# Patient Record
Sex: Male | Born: 1935 | Race: White | Hispanic: No | Marital: Married | Smoking: Former smoker
Health system: Southern US, Community
[De-identification: ages and names within clinical notes are randomized; demographics above are authoritative.]

## PROBLEM LIST (undated history)

## (undated) DIAGNOSIS — R413 Other amnesia: Secondary | ICD-10-CM

## (undated) DIAGNOSIS — E785 Hyperlipidemia, unspecified: Secondary | ICD-10-CM

## (undated) HISTORY — PX: CATARACT EXTRACTION, BILATERAL: SHX1313

## (undated) HISTORY — PX: APPENDECTOMY: SHX54

## (undated) HISTORY — DX: Other amnesia: R41.3

## (undated) HISTORY — PX: HAMMER TOE SURGERY: SHX385

---

## 2010-10-06 DEATH — deceased

## 2014-03-13 DIAGNOSIS — I358 Other nonrheumatic aortic valve disorders: Secondary | ICD-10-CM | POA: Insufficient documentation

## 2014-03-13 DIAGNOSIS — Z5181 Encounter for therapeutic drug level monitoring: Secondary | ICD-10-CM | POA: Insufficient documentation

## 2015-01-15 DIAGNOSIS — Z86010 Personal history of colonic polyps: Secondary | ICD-10-CM | POA: Insufficient documentation

## 2019-09-01 ENCOUNTER — Encounter: Payer: Self-pay | Admitting: Podiatry

## 2019-09-01 ENCOUNTER — Other Ambulatory Visit: Payer: Self-pay

## 2019-09-01 ENCOUNTER — Ambulatory Visit (INDEPENDENT_AMBULATORY_CARE_PROVIDER_SITE_OTHER): Payer: Medicare Other | Admitting: Podiatry

## 2019-09-01 VITALS — BP 154/84 | HR 49

## 2019-09-01 DIAGNOSIS — L84 Corns and callosities: Secondary | ICD-10-CM | POA: Diagnosis not present

## 2019-09-01 DIAGNOSIS — B351 Tinea unguium: Secondary | ICD-10-CM | POA: Diagnosis not present

## 2019-09-01 DIAGNOSIS — M79674 Pain in right toe(s): Secondary | ICD-10-CM

## 2019-09-01 DIAGNOSIS — M79675 Pain in left toe(s): Secondary | ICD-10-CM

## 2019-09-01 NOTE — Patient Instructions (Signed)

## 2019-09-02 ENCOUNTER — Ambulatory Visit: Payer: Self-pay

## 2019-09-06 NOTE — Progress Notes (Addendum)
Subjective: Shaun [name] presents today referred by Patient, No Pcp Per for complaint of callus(es) b/l feet and painful mycotic toenails b/l that are difficult to trim. Pain interferes with ambulation. Aggravating factors include wearing enclosed shoe gear. Pain is relieved with periodic professional debridement.   He is a retired Biochemist, clinical.  He relates thick, discolored toenails which are tender and calluses >20 years. He has shaved his calluses with scalpel blade over the years. Now seeking professional help to assist with treatment.  History reviewed. No pertinent past medical history.   Patient Active Problem List   Diagnosis Date Noted  . History of colon polyps 01/15/2015  . Aortic valve sclerosis 03/13/2014  . Encounter for therapeutic drug level monitoring 03/13/2014  . GERD (gastroesophageal reflux disease) 08/28/2011  . Hypercholesteremia 08/28/2011     History reviewed. No pertinent surgical history.   Current Outpatient Medications on File Prior to Visit  Medication Sig Dispense Refill  . atorvastatin (LIPITOR) 10 MG tablet Take by mouth.    . Multiple Vitamins-Minerals (MULTIVITAMIN WITH MINERALS) tablet Take 1 tablet by mouth daily.    Marland Kitchen esomeprazole (NEXIUM) 20 MG capsule Take by mouth.     No current facility-administered medications on file prior to visit.     No Known Allergies   Social History   Occupational History  . Not on file  Tobacco Use  . Smoking status: Never Smoker  . Smokeless tobacco: Never Used  Substance and Sexual Activity  . Alcohol use: Not on file  . Drug use: Not on file  . Sexual activity: Not on file     History reviewed. No pertinent family history.    There is no immunization history on file for this patient.   Objective: Vitals:   09/01/19 0905  BP: (!) 154/84  Pulse: (!) 49    Vascular Examination:  Capillary refill time to digits immediate b/l, palpable DP pulses b/l, palpable PT pulses b/l, pedal hair  sparse b/l and skin temperature gradient within normal limits b/l  Dermatological Examination: Pedal skin with normal turgor, texture and tone bilaterally, no open wounds bilaterally, no interdigital macerations bilaterally, toenails 1-5 b/l elongated, dystrophic, thickened, crumbly with subungual debris and hyperkeratotic lesion(s) submet head 5 b/l.  No erythema, no edema, no drainage, no flocculence  Musculoskeletal: Normal muscle strength 5/5 to all lower extremity muscle groups bilaterally, no gross bony deformities bilaterally, no pain crepitus or joint limitation noted with ROM b/l and bunion deformity noted b/l  Neurological: Protective sensation intact 5/5 intact bilaterally with 10g monofilament b/l, vibratory sensation intact b/l and proprioception intact bilaterally  Assessment: No diagnosis found.   Plan: -Medicare ABN signed for 2021 -Toenails 1-5 b/l were debrided in length and girth without iatrogenic bleeding. -corns and calluses were debrided without complication or incident. Total number debrided =2, submet head 5 b/l -Patient to continue soft, supportive shoe gear daily. -Patient to report any pedal injuries to medical professional immediately. -Patient/POA to call should there be question/concern in the interim.  Return in about 3 months (around 11/30/2019) for nail and callus trim.

## 2019-09-11 ENCOUNTER — Ambulatory Visit: Payer: Medicare Other | Attending: Internal Medicine

## 2019-09-11 DIAGNOSIS — Z23 Encounter for immunization: Secondary | ICD-10-CM | POA: Insufficient documentation

## 2019-09-11 NOTE — Progress Notes (Signed)
   Covid-19 Vaccination Clinic  Name:  Kavish Lafitte    MRN: [id number] DOB: September 25, 1934  09/11/2019  Mr. Pieczynski was observed post Covid-19 immunization for 15 minutes without incidence. He was provided with Vaccine Information Sheet and instruction to access the V-Safe system.   Mr. Stroebel was instructed to call 911 with any severe reactions post vaccine: Marland Kitchen Difficulty breathing  . Swelling of your face and throat  . A fast heartbeat  . A bad rash all over your body  . Dizziness and weakness    Immunizations Administered    Name Date Dose VIS Date Route   Pfizer COVID-19 Vaccine 09/11/2019  4:43 PM 0.3 mL 07/18/2019 Intramuscular   Manufacturer: [employer], [employer]   Lot: [id number]   NDC: [address]-6

## 2019-09-23 ENCOUNTER — Ambulatory Visit: Payer: Self-pay

## 2019-09-26 DIAGNOSIS — H409 Unspecified glaucoma: Secondary | ICD-10-CM | POA: Insufficient documentation

## 2019-09-26 DIAGNOSIS — R030 Elevated blood-pressure reading, without diagnosis of hypertension: Secondary | ICD-10-CM | POA: Insufficient documentation

## 2019-09-26 DIAGNOSIS — Z87440 Personal history of urinary (tract) infections: Secondary | ICD-10-CM | POA: Insufficient documentation

## 2019-10-06 ENCOUNTER — Ambulatory Visit: Payer: Medicare Other | Attending: Internal Medicine

## 2019-10-06 DIAGNOSIS — Z23 Encounter for immunization: Secondary | ICD-10-CM | POA: Insufficient documentation

## 2019-10-06 NOTE — Progress Notes (Signed)
   Covid-19 Vaccination Clinic  Name:  Shaun [name]    MRN: [id number] DOB: Nov 28, 1934  10/06/2019  [name] was observed post Covid-19 immunization for 15 minutes without incidence. He was provided with Vaccine Information Sheet and instruction to access the V-Safe system.   [name] was instructed to call 911 with any severe reactions post vaccine: Marland Kitchen Difficulty breathing  . Swelling of your face and throat  . A fast heartbeat  . A bad rash all over your body  . Dizziness and weakness    Immunizations Administered    Name Date Dose VIS Date Route   Pfizer COVID-19 Vaccine 10/06/2019  2:14 PM 0.3 mL 07/18/2019 Intramuscular   Manufacturer: [employer], [employer]   Lot: [id number]   NDC: [address]-1

## 2019-12-01 ENCOUNTER — Encounter: Payer: Self-pay | Admitting: Podiatry

## 2019-12-01 ENCOUNTER — Ambulatory Visit (INDEPENDENT_AMBULATORY_CARE_PROVIDER_SITE_OTHER): Payer: Medicare Other | Admitting: Podiatry

## 2019-12-01 ENCOUNTER — Other Ambulatory Visit: Payer: Self-pay

## 2019-12-01 VITALS — HR 96

## 2019-12-01 DIAGNOSIS — M79675 Pain in left toe(s): Secondary | ICD-10-CM

## 2019-12-01 DIAGNOSIS — B351 Tinea unguium: Secondary | ICD-10-CM

## 2019-12-01 DIAGNOSIS — M79674 Pain in right toe(s): Secondary | ICD-10-CM | POA: Diagnosis not present

## 2019-12-01 DIAGNOSIS — M79672 Pain in left foot: Secondary | ICD-10-CM

## 2019-12-01 DIAGNOSIS — L84 Corns and callosities: Secondary | ICD-10-CM | POA: Diagnosis not present

## 2019-12-01 DIAGNOSIS — M79671 Pain in right foot: Secondary | ICD-10-CM | POA: Diagnosis not present

## 2019-12-01 NOTE — Progress Notes (Signed)
Subjective: Shaun [name] presents today for follow up of callus(es) b/l and painful mycotic toenails b/l that are difficult to trim. Pain interferes with ambulation. Aggravating factors include wearing enclosed shoe gear. Pain is relieved with periodic professional debridement.   No Known Allergies   Objective: Vitals:   12/01/19 0856  Pulse: 96    Pt 85 y.o. year old male  in NAD. AAO x 3.   Vascular Examination:  Capillary refill time to digits immediate b/l. Palpable DP pulses b/l. Palpable PT pulses b/l. Pedal hair sparse b/l. Skin temperature gradient within normal limits b/l.  Dermatological Examination: Pedal skin with normal turgor, texture and tone bilaterally. No open wounds bilaterally. No interdigital macerations bilaterally. Toenails 1-5 b/l elongated, dystrophic, thickened, crumbly with subungual debris and tenderness to dorsal palpation. Hyperkeratotic lesion(s) submet head 5 left foot and submet head 5 right foot.  No erythema, no edema, no drainage, no flocculence.  Musculoskeletal: Normal muscle strength 5/5 to all lower extremity muscle groups bilaterally. No pain crepitus or joint limitation noted with ROM b/l. Hallux valgus with bunion deformity noted b/l. Hammertoes noted to the 2-5 bilaterally.  Neurological: Protective sensation intact 5/5 intact bilaterally with 10g monofilament b/l. Vibratory sensation intact b/l. Proprioception intact bilaterally. Babinski reflex negative b/l. Clonus negative b/l.  Assessment: 1. Pain due to onychomycosis of toenails of both feet   2. Callus   3. Pain in both feet    Plan: -ABN on file for 2021. -Toenails 1-5 b/l were debrided in length and girth with sterile nail nippers and dremel without iatrogenic bleeding.  -Callus(es) submet head 5 left foot and submet head 5 right foot were debrided without complication or incident. Total number debrided =2. -Patient to continue soft, supportive shoe gear daily. -Patient to  report any pedal injuries to medical professional immediately. -Patient/POA to call should there be question/concern in the interim.  Return in about 3 months (around 03/01/2020) for nail and callus trim.

## 2019-12-01 NOTE — Patient Instructions (Signed)
Corns and Calluses Corns are small areas of thickened skin that occur on the top, sides, or tip of a toe. They contain a cone-shaped core with a point that can press on a nerve below. This causes pain.  Calluses are areas of thickened skin that can occur anywhere on the body, including the hands, fingers, palms, soles of the feet, and heels. Calluses are usually larger than corns. What are the causes? Corns and calluses are caused by rubbing (friction) or pressure, such as from shoes that are too tight or do not fit properly. What increases the risk? Corns are more likely to develop in people who have misshapen toes (toe deformities), such as hammer toes. Calluses can occur with friction to any area of the skin. They are more likely to develop in people who:  Work with their hands.  Wear shoes that fit poorly, are too tight, or are high-heeled.  Have toe deformities. What are the signs or symptoms? Symptoms of a corn or callus include:  A hard growth on the skin.  Pain or tenderness under the skin.  Redness and swelling.  Increased discomfort while wearing tight-fitting shoes, if your feet are affected. If a corn or callus becomes infected, symptoms may include:  Redness and swelling that gets worse.  Pain.  Fluid, blood, or pus draining from the corn or callus. How is this diagnosed? Corns and calluses may be diagnosed based on your symptoms, your medical history, and a physical exam. How is this treated? Treatment for corns and calluses may include:  Removing the cause of the friction or pressure. This may involve: ? Changing your shoes. ? Wearing shoe inserts (orthotics) or other protective layers in your shoes, such as a corn pad. ? Wearing gloves.  Applying medicine to the skin (topical medicine) to help soften skin in the hardened, thickened areas.  Removing layers of dead skin with a file to reduce the size of the corn or callus.  Removing the corn or callus with a  scalpel or laser.  Taking antibiotic medicines, if your corn or callus is infected.  Having surgery, if a toe deformity is the cause. Follow these instructions at home:   Take over-the-counter and prescription medicines only as told by your health care provider.  If you were prescribed an antibiotic, take it as told by your health care provider. Do not stop taking it even if your condition starts to improve.  Wear shoes that fit well. Avoid wearing high-heeled shoes and shoes that are too tight or too loose.  Wear any padding, protective layers, gloves, or orthotics as told by your health care provider.  Soak your hands or feet and then use a file or pumice stone to soften your corn or callus. Do this as told by your health care provider.  Check your corn or callus every day for symptoms of infection. Contact a health care provider if you:  Notice that your symptoms do not improve with treatment.  Have redness or swelling that gets worse.  Notice that your corn or callus becomes painful.  Have fluid, blood, or pus coming from your corn or callus.  Have new symptoms. Summary  Corns are small areas of thickened skin that occur on the top, sides, or tip of a toe.  Calluses are areas of thickened skin that can occur anywhere on the body, including the hands, fingers, palms, and soles of the feet. Calluses are usually larger than corns.  Corns and calluses are caused by   rubbing (friction) or pressure, such as from shoes that are too tight or do not fit properly.  Treatment may include wearing any padding, protective layers, gloves, or orthotics as told by your health care provider. This information is not intended to replace advice given to you by your health care provider. Make sure you discuss any questions you have with your health care provider. Document Revised: 11/13/2018 Document Reviewed: 06/06/2017 Elsevier Patient Education  2020 [employer]  Onychomycosis/Fungal  Toenails  WHAT IS IT? An infection that lies within the keratin of your nail plate that is caused by a fungus.  WHY ME? Fungal infections affect all ages, sexes, races, and creeds.  There may be many factors that predispose you to a fungal infection such as age, coexisting medical conditions such as diabetes, or an autoimmune disease; stress, medications, fatigue, genetics, etc.  Bottom line: fungus thrives in a warm, moist environment and your shoes offer such a location.  IS IT CONTAGIOUS? Theoretically, yes.  You do not want to share shoes, nail clippers or files with someone who has fungal toenails.  Walking around barefoot in the same room or sleeping in the same bed is unlikely to transfer the organism.  It is important to realize, however, that fungus can spread easily from one nail to the next on the same foot.  HOW DO WE TREAT THIS?  There are several ways to treat this condition.  Treatment may depend on many factors such as age, medications, pregnancy, liver and kidney conditions, etc.  It is best to ask your doctor which options are available to you.  4. No treatment.   Unlike many other medical concerns, you can live with this condition.  However for many people this can be a painful condition and may lead to ingrown toenails or a bacterial infection.  It is recommended that you keep the nails cut short to help reduce the amount of fungal nail. 5. Topical treatment.  These range from herbal remedies to prescription strength nail lacquers.  About 40-50% effective, topicals require twice daily application for approximately 9 to 12 months or until an entirely new nail has grown out.  The most effective topicals are medical grade medications available through physicians offices. 6. Oral antifungal medications.  With an 80-90% cure rate, the most common oral medication requires 3 to 4 months of therapy and stays in your system for a year as the new nail grows out.  Oral antifungal medications do  require blood work to make sure it is a safe drug for you.  A liver function panel will be performed prior to starting the medication and after the first month of treatment.  It is important to have the blood work performed to avoid any harmful side effects.  In general, this medication safe but blood work is required. 7. Laser Therapy.  This treatment is performed by applying a specialized laser to the affected nail plate.  This therapy is noninvasive, fast, and non-painful.  It is not covered by insurance and is therefore, out of pocket.  The results have been very good with a 80-95% cure rate.  The [facility] is the only practice in the area to offer this therapy. 8. Permanent Nail Avulsion.  Removing the entire nail so that a new nail will not grow back. 

## 2020-03-01 ENCOUNTER — Other Ambulatory Visit: Payer: Self-pay

## 2020-03-01 ENCOUNTER — Encounter: Payer: Self-pay | Admitting: Podiatry

## 2020-03-01 ENCOUNTER — Ambulatory Visit (INDEPENDENT_AMBULATORY_CARE_PROVIDER_SITE_OTHER): Payer: Medicare Other | Admitting: Podiatry

## 2020-03-01 DIAGNOSIS — I872 Venous insufficiency (chronic) (peripheral): Secondary | ICD-10-CM

## 2020-03-01 DIAGNOSIS — M79675 Pain in left toe(s): Secondary | ICD-10-CM | POA: Diagnosis not present

## 2020-03-01 DIAGNOSIS — M79671 Pain in right foot: Secondary | ICD-10-CM

## 2020-03-01 DIAGNOSIS — R600 Localized edema: Secondary | ICD-10-CM | POA: Diagnosis not present

## 2020-03-01 DIAGNOSIS — L84 Corns and callosities: Secondary | ICD-10-CM

## 2020-03-01 DIAGNOSIS — B351 Tinea unguium: Secondary | ICD-10-CM | POA: Diagnosis not present

## 2020-03-01 DIAGNOSIS — M79674 Pain in right toe(s): Secondary | ICD-10-CM | POA: Diagnosis not present

## 2020-03-01 NOTE — Progress Notes (Signed)
Subjective: Shaun [name] presents today for follow up of callus(es) b/l and painful mycotic toenails b/l that are difficult to trim. Pain interferes with ambulation. Aggravating factors include wearing enclosed shoe gear. Pain is relieved with periodic professional debridement.   He states his wife mentioned he has swelling in his ankles. He relates no pain, no evidence of trauma. 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   No Known Allergies   Objective: There were no vitals filed for this visit.  Pt is a pleasant 85 y.o. year old Caucasian male  in NAD. AAO x 3.   Vascular Examination:  Capillary refill time to digits immediate b/l. Palpable pedal pulses b/l LE. Pedal hair sparse. Lower extremity skin temperature gradient within normal limits. +1 pitting edema left ankle and right ankle.  Dermatological Examination: Pedal skin with normal turgor, texture and tone bilaterally. No open wounds bilaterally. No interdigital macerations bilaterally. Toenails 1-5 b/l elongated, discolored, dystrophic, thickened, crumbly with subungual debris and tenderness to dorsal palpation. Hyperkeratotic lesion(s) R 2nd toe, submet head 5 left foot and submet head 5 right foot.  No erythema, no edema, no drainage, no flocculence.  Musculoskeletal: Normal muscle strength 5/5 to all lower extremity muscle groups bilaterally. No pain crepitus or joint limitation noted with ROM b/l. Hallux valgus with bunion deformity noted b/l lower extremities. Hammertoes noted to the 2-5 bilaterally. Patient ambulates independent of any assistive aids.  Neurological: Protective sensation intact 5/5 intact bilaterally with 10g monofilament b/l. Vibratory sensation intact b/l. Proprioception intact bilaterally. Babinski reflex negative b/l. Clonus negative b/l.  Assessment: 1. Pain due to onychomycosis of toenails of both feet   2. Callus   3. Pain in both feet   4. Localized  edema   5. Venous (peripheral) insufficiency    Plan: -ABN on file for 2021. -Toenails 1-5 b/l were debrided in length and girth with sterile nail nippers and dremel without iatrogenic bleeding.  -Callus(es) submet head 5 left foot and submet head 5 right foot were debrided without complication or incident. Total number debrided =2. -Discussed edema and need for compression knee highs. Prescription given for [facility] for compression knee highs, 15-20 mmHg. Information dispensed on how to apply compression hose and chronic venous insufficiency. -Patient to continue soft, supportive shoe gear daily. -Patient to report any pedal injuries to medical professional immediately. -Patient/POA to call should there be question/concern in the interim.  Return in about 3 months (around 06/01/2020) for nail and callus trim.

## 2020-03-01 NOTE — Patient Instructions (Signed)
How to Use Compression Stockings Compression stockings are elastic socks that squeeze the legs. They help increase blood flow (circulation) to the legs, decrease swelling in the legs, and reduce the chance of developing blood clots in the lower legs. Compression stockings are often used by people who:  Are recovering from surgery.  Have poor circulation in their legs.  Tend to get blood clots in their legs.  Have bulging (varicose) veins.  Sit or stay in bed for long periods of time. Follow instructions from your health care provider about how and when to wear your compression stockings. How to wear compression stockings Before you put on your compression stockings:  Make sure that they are the correct size and degree of compression. If you do not know your size or required grade of compression, ask your health care provider and follow the manufacturer's instructions that come with the stockings.  Make sure that they are clean, dry, and in good condition.  Check them for rips and tears. Do not put them on if they are ripped or torn. Put your stockings on first thing in the morning, before you get out of bed. Keep them on for as long as your health care provider advises. When you are wearing your stockings:  Keep them as smooth as possible. Do not allow them to bunch up. It is especially important to prevent the stockings from bunching up around your toes or behind your knees.  Do not roll the stockings downward and leave them rolled down. This can decrease blood flow to your leg.  Change them right away if they become wet or dirty. When you take off your stockings, inspect your legs and feet. Check for:  Open sores.  Red spots.  Swelling. General tips  Do not stop wearing compression stockings without talking to your health care provider first.  Wash your stockings every day with mild detergent in cold or warm water. Do not use bleach. Air-dry your stockings or dry them in a  clothes dryer on low heat. It may be helpful to have two pairs so that you have a pair to wear while the other is being washed.  Replace your stockings every 3-6 months.  If skin moisturizing is part of your treatment plan, apply lotion or cream at night so that your skin will be dry when you put on the stockings in the morning. It is harder to put the stockings on when you have lotion on your legs or feet.  Wear nonskid shoes or slip-resistant socks when walking while wearing compression stockings. Contact a health care provider and remove your stockings if you have:  A feeling of pins and needles in your feet or legs.  Open sores, red spots, or other skin changes on your feet or legs.  Swelling or pain that gets worse. Get help right away if you have:  Numbness or tingling in your lower legs that does not get better right after you take the stockings off.  Toes or feet that are unusually cold or turn a bluish color.  A warm or red area on your leg.  New swelling or soreness in your leg.  Shortness of breath.  Chest pain.  A fast or irregular heartbeat.  Light-headedness.  Dizziness. Summary  Compression stockings are elastic socks that squeeze the legs.  They help increase blood flow (circulation) to the legs, decrease swelling in the legs, and reduce the chance of developing blood clots in the lower legs.  Follow instructions  from your health care provider about how and when to wear your compression stockings.  Do not stop wearing your compression stockings without talking to your health care provider first. This information is not intended to replace advice given to you by your health care provider. Make sure you discuss any questions you have with your health care provider. Document Revised: 07/26/2017 Document Reviewed: 07/26/2017 Elsevier Patient Education  2020 [employer]  Chronic Venous Insufficiency Chronic venous insufficiency is a condition where the leg  veins cannot effectively pump blood from the legs to the heart. This happens when the vein walls are either stretched, weakened, or damaged, or when the valves inside the vein are damaged. With the right treatment, you should be able to continue with an active life. This condition is also called venous stasis. What are the causes? Common causes of this condition include:  High blood pressure inside the veins (venous hypertension).  Sitting or standing too long, causing increased blood pressure in the leg veins.  A blood clot that blocks blood flow in a vein (deep vein thrombosis, DVT).  Inflammation of a vein (phlebitis) that causes a blood clot to form.  Tumors in the pelvis that cause blood to back up. What increases the risk? The following factors may make you more likely to develop this condition:  Having a family history of this condition.  Obesity.  Pregnancy.  Living without enough regular physical activity or exercise (sedentary lifestyle).  Smoking.  Having a job that requires long periods of standing or sitting in one place.  Being a certain age. Women in their 45s and 76s and men in their 93s are more likely to develop this condition. What are the signs or symptoms? Symptoms of this condition include:  Veins that are enlarged, bulging, or twisted (varicose veins).  Skin breakdown or ulcers.  Reddened skin or dark discoloration of skin on the leg between the knee and ankle.  Brown, smooth, tight, and painful skin just above the ankle, usually on the inside of the leg (lipodermatosclerosis).  Swelling of the legs. How is this diagnosed? This condition may be diagnosed based on:  Your medical history.  A physical exam.  Tests, such as: ? A procedure that creates an image of a blood vessel and nearby organs and provides information about blood flow through the blood vessel (duplex ultrasound). ? A procedure that tests blood flow (plethysmography). ? A  procedure that looks at the veins using X-ray and dye (venogram). How is this treated? The goals of treatment are to help you return to an active life and to minimize pain or disability. Treatment depends on the severity of your condition, and it may include:  Wearing compression stockings. These can help relieve symptoms and help prevent your condition from getting worse. However, they do not cure the condition.  Sclerotherapy. This procedure involves an injection of a solution that shrinks damaged veins.  Surgery. This may involve: ? Removing a diseased vein (vein stripping). ? Cutting off blood flow through the vein (laser ablation surgery). ? Repairing or reconstructing a valve within the affected vein. Follow these instructions at home:      Wear compression stockings as told by your health care provider. These stockings help to prevent blood clots and reduce swelling in your legs.  Take over-the-counter and prescription medicines only as told by your health care provider.  Stay active by exercising, walking, or doing different activities. Ask your health care provider what activities are safe for  you and how much exercise you need.  Drink enough fluid to keep your urine pale yellow.  Do not use any products that contain nicotine or tobacco, such as cigarettes, e-cigarettes, and chewing tobacco. If you need help quitting, ask your health care provider.  Keep all follow-up visits as told by your health care provider. This is important. Contact a health care provider if you:  Have redness, swelling, or more pain in the affected area.  See a red streak or line that goes up or down from the affected area.  Have skin breakdown or skin loss in the affected area, even if the breakdown is small.  Get an injury in the affected area. Get help right away if:  You get an injury and an open wound in the affected area.  You have: ? Severe pain that does not get better with  medicine. ? Sudden numbness or weakness in the foot or ankle below the affected area. ? Trouble moving your foot or ankle. ? A fever. ? Worse or persistent symptoms. ? Chest pain. ? Shortness of breath. Summary  Chronic venous insufficiency is a condition where the leg veins cannot effectively pump blood from the legs to the heart.  Chronic venous insufficiency occurs when the vein walls become stretched, weakened, or damaged, or when valves within the vein are damaged.  Treatment depends on how severe your condition is. It often involves wearing compression stockings and may involve having a procedure.  Make sure you stay active by exercising, walking, or doing different activities. Ask your health care provider what activities are safe for you and how much exercise you need. This information is not intended to replace advice given to you by your health care provider. Make sure you discuss any questions you have with your health care provider. Document Revised: 04/16/2018 Document Reviewed: 04/16/2018 Elsevier Patient Education  2020 [employer].

## 2020-05-11 ENCOUNTER — Ambulatory Visit: Payer: Medicare Other | Attending: Internal Medicine

## 2020-05-11 DIAGNOSIS — Z23 Encounter for immunization: Secondary | ICD-10-CM

## 2020-05-11 NOTE — Progress Notes (Signed)
   Covid-19 Vaccination Clinic  Name:  Shaun [name]    MRN: [id number] DOB: [birth year]  05/11/2020  Mr. [name] was observed post Covid-19 immunization for 15 minutes without incident. He was provided with Vaccine Information Sheet and instruction to access the V-Safe system.   Mr. [name] was instructed to call 911 with any severe reactions post vaccine: Marland Kitchen Difficulty breathing  . Swelling of face and throat  . A fast heartbeat  . A bad rash all over body  . Dizziness and weakness

## 2020-06-01 ENCOUNTER — Ambulatory Visit (INDEPENDENT_AMBULATORY_CARE_PROVIDER_SITE_OTHER): Payer: Medicare Other | Admitting: Podiatry

## 2020-06-01 ENCOUNTER — Other Ambulatory Visit: Payer: Self-pay

## 2020-06-01 ENCOUNTER — Encounter: Payer: Self-pay | Admitting: Podiatry

## 2020-06-01 DIAGNOSIS — M79672 Pain in left foot: Secondary | ICD-10-CM

## 2020-06-01 DIAGNOSIS — B351 Tinea unguium: Secondary | ICD-10-CM

## 2020-06-01 DIAGNOSIS — M79671 Pain in right foot: Secondary | ICD-10-CM | POA: Diagnosis not present

## 2020-06-01 DIAGNOSIS — M79675 Pain in left toe(s): Secondary | ICD-10-CM | POA: Diagnosis not present

## 2020-06-01 DIAGNOSIS — L84 Corns and callosities: Secondary | ICD-10-CM

## 2020-06-01 DIAGNOSIS — M79674 Pain in right toe(s): Secondary | ICD-10-CM | POA: Diagnosis not present

## 2020-06-05 NOTE — Progress Notes (Signed)
Subjective: Shaun [name] presents today for follow up of callus(es) b/l and painful mycotic toenails b/l that are difficult to trim. Pain interferes with ambulation. Aggravating factors include wearing enclosed shoe gear. Pain is relieved with periodic professional debridement.   He voice no new pedal problems on today's visit.  No Known Allergies   Objective: There were no vitals filed for this visit.  Pt is a pleasant 85 y.o. year old Caucasian male  in NAD. AAO x 3.   Vascular Examination:  Capillary refill time to digits immediate b/l. Palpable pedal pulses b/l LE. Pedal hair sparse. Lower extremity skin temperature gradient within normal limits. +1 pitting edema left ankle and right ankle.  Dermatological Examination: Pedal skin with normal turgor, texture and tone bilaterally. No open wounds bilaterally. No interdigital macerations bilaterally. Toenails 1-5 b/l elongated, discolored, dystrophic, thickened, crumbly with subungual debris and tenderness to dorsal palpation. Hyperkeratotic lesion(s) L 4th toe, R 3rd toe, submet head 5 left foot and submet head 5 right foot.  No erythema, no edema, no drainage, no flocculence.  Musculoskeletal: Normal muscle strength 5/5 to all lower extremity muscle groups bilaterally. No pain crepitus or joint limitation noted with ROM b/l. Hallux valgus with bunion deformity noted b/l lower extremities. Hammertoes noted to the 2-5 bilaterally. Patient ambulates independent of any assistive aids.  Neurological: Protective sensation intact 5/5 intact bilaterally with 10g monofilament b/l. Vibratory sensation intact b/l. Proprioception intact bilaterally. Babinski reflex negative b/l. Clonus negative b/l.  Assessment: 1. Pain due to onychomycosis of toenails of both feet   2. Corns and callosities   3. Pain in both feet    Plan: -ABN on file for 2021.    -Toenails 1-5 b/l were debrided in length and girth with sterile nail nippers and dremel  without iatrogenic bleeding.  -Corns distal tip right 3rd digit and distal tip left 4th toe. Callus(es) submet head 5 left foot and submet head 5 right foot were debrided without complication or incident. Total number debrided =4. -Patient to continue soft, supportive shoe gear daily. -Patient to report any pedal injuries to medical professional immediately. -Patient/POA to call should there be question/concern in the interim.  Return in about 3 months (around 09/01/2020) for toenail debridement w/corn(s)/callus(es).

## 2020-09-07 ENCOUNTER — Ambulatory Visit (INDEPENDENT_AMBULATORY_CARE_PROVIDER_SITE_OTHER): Payer: Medicare Other | Admitting: Podiatry

## 2020-09-07 ENCOUNTER — Other Ambulatory Visit: Payer: Self-pay

## 2020-09-07 ENCOUNTER — Encounter: Payer: Self-pay | Admitting: Podiatry

## 2020-09-07 DIAGNOSIS — B351 Tinea unguium: Secondary | ICD-10-CM | POA: Diagnosis not present

## 2020-09-07 DIAGNOSIS — M79675 Pain in left toe(s): Secondary | ICD-10-CM

## 2020-09-07 DIAGNOSIS — M79671 Pain in right foot: Secondary | ICD-10-CM

## 2020-09-07 DIAGNOSIS — M79672 Pain in left foot: Secondary | ICD-10-CM

## 2020-09-07 DIAGNOSIS — L84 Corns and callosities: Secondary | ICD-10-CM | POA: Diagnosis not present

## 2020-09-07 DIAGNOSIS — M79674 Pain in right toe(s): Secondary | ICD-10-CM

## 2020-09-07 NOTE — Progress Notes (Signed)
Subjective: Shaun [name] presents today for follow up of callus(es) b/l and painful mycotic toenails b/l that are difficult to trim. Pain interferes with ambulation. Aggravating factors include wearing enclosed shoe gear. Pain is relieved with periodic professional debridement.   He voice no new pedal problems on today's visit.  No Known Allergies   Objective: There were no vitals filed for this visit.  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 AAO x 3.   Vascular Examination:  Capillary refill time to digits immediate b/l. Palpable pedal pulses b/l LE. Pedal hair sparse. Lower extremity skin temperature gradient within normal limits. +1 pitting edema left ankle and right ankle.  Dermatological Examination: Pedal skin with normal turgor, texture and tone bilaterally. No open wounds bilaterally. No interdigital macerations bilaterally. Toenails 1-5 b/l elongated, discolored, dystrophic, thickened, crumbly with subungual debris and tenderness to dorsal palpation. Hyperkeratotic lesion(s) L 4th toe, R 3rd toe, submet head 5 left foot and submet head 5 right foot.  No erythema, no edema, no drainage, no flocculence.  Musculoskeletal: Normal muscle strength 5/5 to all lower extremity muscle groups bilaterally. No pain crepitus or joint limitation noted with ROM b/l. Hallux valgus with bunion deformity noted b/l lower extremities. Hammertoes noted to the 2-5 bilaterally. Patient ambulates independent of any assistive aids.  Neurological: Protective sensation intact 5/5 intact bilaterally with 10g monofilament b/l. Vibratory sensation intact b/l. Proprioception intact bilaterally. Babinski reflex negative b/l. Clonus negative b/l.  Assessment: 1. Pain due to onychomycosis of toenails of both feet   2. Corns and callosities   3. Pain in both feet    Plan: -ABN on file for 2022.  -Toenails 1-5 b/l were debrided in length and girth with sterile nail nippers and  dremel without iatrogenic bleeding.  -Corns distal tip right 3rd digit and distal tip left 4th toe. Callus(es) submet head 5 left foot and submet head 5 right foot were debrided without complication or incident. Total number debrided =4. -Patient to continue soft, supportive shoe gear daily. -Patient to report any pedal injuries to medical professional immediately. -Patient/POA to call should there be question/concern in the interim.  Return in about 3 months (around 12/05/2020).

## 2020-12-17 ENCOUNTER — Ambulatory Visit: Payer: Medicare Other | Admitting: Podiatry

## 2021-03-04 ENCOUNTER — Ambulatory Visit (INDEPENDENT_AMBULATORY_CARE_PROVIDER_SITE_OTHER): Payer: Medicare Other | Admitting: Podiatry

## 2021-03-04 DIAGNOSIS — M79675 Pain in left toe(s): Secondary | ICD-10-CM

## 2021-03-04 DIAGNOSIS — M79674 Pain in right toe(s): Secondary | ICD-10-CM

## 2021-03-04 DIAGNOSIS — B351 Tinea unguium: Secondary | ICD-10-CM

## 2021-03-16 ENCOUNTER — Ambulatory Visit (INDEPENDENT_AMBULATORY_CARE_PROVIDER_SITE_OTHER): Payer: Medicare Other | Admitting: Podiatry

## 2021-03-16 ENCOUNTER — Encounter: Payer: Self-pay | Admitting: Podiatry

## 2021-03-16 ENCOUNTER — Other Ambulatory Visit: Payer: Self-pay

## 2021-03-16 DIAGNOSIS — B351 Tinea unguium: Secondary | ICD-10-CM | POA: Diagnosis not present

## 2021-03-16 DIAGNOSIS — M79674 Pain in right toe(s): Secondary | ICD-10-CM | POA: Diagnosis not present

## 2021-03-16 DIAGNOSIS — M79672 Pain in left foot: Secondary | ICD-10-CM | POA: Diagnosis not present

## 2021-03-16 DIAGNOSIS — L84 Corns and callosities: Secondary | ICD-10-CM

## 2021-03-16 DIAGNOSIS — M79671 Pain in right foot: Secondary | ICD-10-CM | POA: Diagnosis not present

## 2021-03-16 DIAGNOSIS — M79675 Pain in left toe(s): Secondary | ICD-10-CM | POA: Diagnosis not present

## 2021-03-20 NOTE — Progress Notes (Signed)
  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  Pain interferes with ambulation. Aggravating factors include wearing enclosed shoe gear. Painful toenails interfere with ambulation. Aggravating factors include wearing enclosed shoe gear. Pain is relieved with periodic professional debridement. Painful corns and calluses are aggravated when weightbearing with and without shoegear. Pain is relieved with periodic professional debridement.   No Known Allergies  Review of Systems: Negative except as noted in the HPI. Objective:   Constitutional Shaun [name] is a pleasant 86 y.o. Caucasian male, WD, WN in NAD. AAO x 3.   Vascular Capillary refill time to digits immediate b/l. Palpable DP pulse(s) b/l lower extremities Palpable PT pulse(s) b/l lower extremities Pedal hair sparse. Lower extremity skin temperature gradient within normal limits. Patient wearing compression hose on today's visit. +1 pitting edema b/l lower extremities. No cyanosis or clubbing noted.  Neurologic Normal speech. Oriented to person, place, and time. Protective sensation intact 5/5 intact bilaterally with 10g monofilament b/l.  Dermatologic Pedal skin with normal turgor, texture and tone b/l lower extremities. No open wounds b/l lower extremities. No interdigital macerations b/l lower extremities. Toenails 1-5 b/l elongated, discolored, dystrophic, thickened, crumbly with subungual debris and tenderness to dorsal palpation. Hyperkeratotic lesion(s) L 4th toe, R 3rd toe, submet head 5 left foot, and submet head 5 right foot.  No erythema, no edema, no drainage, no fluctuance.  Orthopedic: Normal muscle strength 5/5 to all lower extremity muscle groups bilaterally. No pain crepitus or joint limitation noted with ROM b/l lower extremities. Hallux valgus with bunion deformity noted  b/l lower extremities. Hammertoe(s) noted to the 2-5 bilaterally.   Radiographs: None Assessment:   1. Pain due to onychomycosis of toenails of both feet   2. Corns and callosities   3. Pain in both feet    Plan:  -Examined patient. -Medicare ABN signed for this year. Patient consents for services of paring of corns/calluses  today. Copy has been placed in patient's chart. -Patient to continue soft, supportive shoe gear daily. -Toenails 1-5 b/l were debrided in length and girth with sterile nail nippers and dremel without iatrogenic bleeding.  -Corn(s) L 4th toe and R 3rd toe and callus(es) submet head 5 left foot and submet head 5 right foot were pared utilizing sterile scalpel blade without incident. Total number debrided =4. -Patient to report any pedal injuries to medical professional immediately. -Patient/POA to call should there be question/concern in the interim.  Return in about 3 months (around 06/16/2021).  Freddie Breech, DPM

## 2021-03-21 NOTE — Progress Notes (Signed)
Patient  canceled this appointment and rescheduled.

## 2021-04-08 ENCOUNTER — Other Ambulatory Visit (HOSPITAL_COMMUNITY): Payer: Self-pay | Admitting: Internal Medicine

## 2021-04-08 ENCOUNTER — Ambulatory Visit (HOSPITAL_COMMUNITY)
Admission: RE | Admit: 2021-04-08 | Discharge: 2021-04-08 | Disposition: A | Discharge status: Home / Self Care | Payer: Medicare Other | Source: Ambulatory Visit | Attending: Internal Medicine | Admitting: Internal Medicine

## 2021-04-08 ENCOUNTER — Other Ambulatory Visit: Payer: Self-pay

## 2021-04-08 DIAGNOSIS — R600 Localized edema: Secondary | ICD-10-CM | POA: Insufficient documentation

## 2021-04-18 ENCOUNTER — Ambulatory Visit: Payer: Medicare Other | Admitting: Podiatry

## 2021-05-16 ENCOUNTER — Encounter: Payer: Self-pay | Admitting: *Deleted

## 2021-05-17 NOTE — Progress Notes (Signed)
[facility]  PATIENT: Shaun [name] DOB: [birth year]  REFERRING CLINICIAN: Creola Corn, MD HISTORY FROM: self and wife REASON FOR VISIT: memory loss   HISTORICAL  CHIEF COMPLAINT:  Chief Complaint  Patient presents with   New Patient (Initial Visit)    Rm 1 with wife- here for consult on worsening memory.     HISTORY OF PRESENT ILLNESS:  The patient presents for evaluation of memory loss which has been present over the past year. He has had some difficulty with short term memory and has been repeating questions. He will get frustrated when he cannot remember things that have been told to him. Thinks memory has been getting a little bit worse.  TBI: Larey Seat out of a tree as a kid and lost consciousness Stroke:  no past history of stroke Seizures: no past history of seizures Sleep:  no history of sleep apnea.  Has never had sleep study.  Mood:  a little bit more irritable, more easily frustrated  Functional status: independent in all ADLs and IADLs Cooking: He does all of the cooking. Might not remember where utensils are but otherwise no issues Cleaning: housekeeper Shopping: He does grocery shopping without issues Driving: May miss a street while driving but has never gotten seriously lost. No car accidents. Bills: wife handles finances Medications: will occasionally forget to take a medication but will remember later in the day Ever left the stove on by accident?: no Forget how to use items around the house?: no Getting lost going to familiar places?: no Forgetting loved ones names?: no Word finding difficulty? no Sleep: sleeps ~5 hours   Trouble with balance, feels unsteady when walking  OTHER MEDICAL CONDITIONS: GERD, HLD   REVIEW OF SYSTEMS: Full 14 system review of systems performed and negative with exception of: memory loss, imbalance  ALLERGIES: No Known Allergies  HOME MEDICATIONS: Outpatient Medications Prior to Visit  Medication  Sig Dispense Refill   atorvastatin (LIPITOR) 10 MG tablet Take by mouth.     esomeprazole (NEXIUM) 20 MG capsule Take by mouth.     Multiple Vitamins-Minerals (MULTIVITAMIN WITH MINERALS) tablet Take 1 tablet by mouth daily.     VYZULTA 0.024 % SOLN Place 1 drop into the left eye at bedtime. (Patient not taking: Reported on 05/18/2021)     No facility-administered medications prior to visit.    PAST MEDICAL HISTORY: History reviewed. No pertinent past medical history.  PAST SURGICAL HISTORY: Past Surgical History:  Procedure Laterality Date   APPENDECTOMY     as child   CATARACT EXTRACTION, BILATERAL     HAMMER TOE SURGERY      FAMILY HISTORY: Family History  Problem Relation Age of Onset   Alcoholism Father    Cancer Sister    Stroke Maternal Grandfather    Alcoholism Paternal Grandfather     SOCIAL HISTORY: Social History   Socioeconomic History   Marital status: Married    Spouse name: Suzette Battiest   Number of children: 3   Years of education: Not on file   Highest education level: Professional school degree (e.g., MD, DDS, DVM, JD)  Occupational History    Comment: retired oral, Associate Professor  Tobacco Use   Smoking status: Former    Packs/day: 0.50    Years: 20.00    Pack years: 10.00    Types: Cigarettes   Smokeless tobacco: Never  Substance and Sexual Activity   Alcohol use: Yes    Alcohol/week: 7.0 standard drinks    Types:  7 Glasses of wine per week   Drug use: Never   Sexual activity: Not on file  Other Topics Concern   Not on file  Social History Narrative   Left handed    Some caffine    Social Determinants of Health   Financial Resource Strain: Not on file  Food Insecurity: Not on file  Transportation Needs: Not on file  Physical Activity: Not on file  Stress: Not on file  Social Connections: Not on file  Intimate Partner Violence: Not on file     PHYSICAL EXAM  GENERAL EXAM/CONSTITUTIONAL: Vitals:  Vitals:   05/18/21 1010   BP: (!) 142/84  Pulse: (!) 50  SpO2: 98%  Weight: 196 lb (88.9 kg)  Height: 5\' 9"  (1.753 m)   Body mass index is 28.94 kg/m. Wt Readings from Last 3 Encounters:  05/18/21 196 lb (88.9 kg)   Patient is in no distress; well developed, nourished and groomed; neck is supple  CARDIOVASCULAR: Examination of carotid arteries is normal; no carotid bruits Regular rate and rhythm, no murmurs Examination of peripheral vascular system by observation and palpation is normal  EYES: Pupils round and reactive to light, Visual fields full to confrontation, Extraocular movements intacts,   MUSCULOSKELETAL: Gait, strength, tone, movements noted in Neurologic exam below  NEUROLOGIC: MENTAL STATUS:  MMSE - Mini Mental State Exam 05/18/2021  Orientation to time 2  Orientation to Place 5  Registration 3  Attention/ Calculation 3  Recall 3  Language- name 2 objects 2  Language- repeat 0  Language- follow 3 step command 3  Language- read & follow direction 1  Write a sentence 1  Copy design 1  Total score 24   awake, alert, oriented to person, place and time recent and remote memory intact normal attention and concentration language fluent, comprehension intact, naming intact fund of knowledge appropriate  CRANIAL NERVE:  2nd, 3rd, 4th, 6th - pupils equal and reactive to light, visual fields full to confrontation, extraocular muscles intact, no nystagmus 5th - facial sensation symmetric 7th - facial strength symmetric 8th - hearing intact 9th - palate elevates symmetrically, uvula midline 11th - shoulder shrug symmetric 12th - tongue protrusion midline  MOTOR:  normal bulk and tone, no cogwheeling, full strength in the BUE, BLE  SENSORY:  normal and symmetric to light touch, pinprick, temperature, vibration  COORDINATION:  finger-nose-finger, fine finger movements normal, no tremor  REFLEXES:  deep tendon reflexes present and symmetric  GAIT/STATION:   normal     DIAGNOSTIC DATA (LABS, IMAGING, TESTING) - I reviewed patient records, labs, notes, testing and imaging myself where available.  03/2021 TSH, CBC, CMP wnl    ASSESSMENT AND PLAN  86 y.o. year old male with a history of GERD and HLD who presents for evaluation of mild memory loss over the past year. Memory issues have not impacted his functioning and he is independent in his ADLs. MMSE today is 24, which is suggestive of mild memory issues. Discussed diagnosis of mild cognitive impairment and provided information regarding lifestyle changes to help improve attention and working memory. Discussed MRI, however patient would prefer to hold off as he is not significantly impacted by memory changes at this time. Will re-evaluate imaging if memory worsens.    1. Memory loss     PLAN: - Labs: vitamin B12 - Follow up in ~1 year for repeat memory testing, or sooner if symptoms worsen  Orders Placed This Encounter  Procedures   B12 and Folate Panel  No orders of the defined types were placed in this encounter.   Return in about 1 year (around 05/18/2022).  I spent an average of 30 minutes chart reviewing and counseling the patient, with at least 50% of the time face to face with the patient. General brain health measures discussed, including the importance of regular aerobic exercise. Reviewed safety measures including driving safety. Discussed medication options for patients with dementia.  Ocie Doyne, MD 05/18/21 12:29 PM   [facility] [address], [address] [place], [place] [address] [phone]

## 2021-05-18 ENCOUNTER — Other Ambulatory Visit: Payer: Self-pay

## 2021-05-18 ENCOUNTER — Encounter: Payer: Self-pay | Admitting: Psychiatry

## 2021-05-18 ENCOUNTER — Ambulatory Visit (INDEPENDENT_AMBULATORY_CARE_PROVIDER_SITE_OTHER): Payer: Medicare Other | Admitting: Psychiatry

## 2021-05-18 VITALS — BP 142/84 | HR 50 | Ht 69.0 in | Wt 196.0 lb

## 2021-05-18 DIAGNOSIS — R413 Other amnesia: Secondary | ICD-10-CM | POA: Diagnosis not present

## 2021-05-18 NOTE — Patient Instructions (Addendum)
Blood work: Vitamin B12  A person with mild cognitive impairment (MCI) experiences memory problems greater than normally expected with aging, but not serious enough to interfere with daily activities.  The patient with MCI complains of difficulty with memory. Typically, the complaints include trouble remembering the names of people they met recently, trouble remembering the flow of a conversation, and an increased tendency to misplace things, or similar problems. In many cases, the individual will be aware of these difficulties and will compensate with increased reliance on notes and calendars.   Although there is an increased chances of going on to develop dementia, it is not possible currently to predict with certainty which patients with MCI will or will not go on to develop dementia. There is currently no specific treatment for MCI. People leading sedentary lifestyles are at greater risk for developing dementia. Increased physical activity and brain exercise can help with maintaining brain function.  Tasks to improve attention/working memory 1. Good sleep hygiene (7-8 hrs of sleep) 2. Learning a new skill (Painting, Carpentry, Pottery, new language, Knitting). 3.Cognitive exercises (keep a daily journal, Puzzles) 4. Physical exercise and training  (30 min/day X 4 days week) 5. Being on Antidepressant if needed 6.Yoga, Meditation, Tai Chi 7. Decrease alcohol intake 8.Have a clear schedule and structure in daily routine

## 2021-05-19 LAB — B12 AND FOLATE PANEL
Folate: 15.9 ng/mL (ref >3.0)
Vitamin B-12: 339 pg/mL (ref 232–1245)

## 2021-06-20 ENCOUNTER — Other Ambulatory Visit: Payer: Self-pay

## 2021-06-20 ENCOUNTER — Ambulatory Visit (INDEPENDENT_AMBULATORY_CARE_PROVIDER_SITE_OTHER): Payer: Medicare Other | Admitting: Podiatry

## 2021-06-20 ENCOUNTER — Encounter: Payer: Self-pay | Admitting: Podiatry

## 2021-06-20 DIAGNOSIS — M79672 Pain in left foot: Secondary | ICD-10-CM | POA: Diagnosis not present

## 2021-06-20 DIAGNOSIS — M79674 Pain in right toe(s): Secondary | ICD-10-CM

## 2021-06-20 DIAGNOSIS — L84 Corns and callosities: Secondary | ICD-10-CM | POA: Diagnosis not present

## 2021-06-20 DIAGNOSIS — B351 Tinea unguium: Secondary | ICD-10-CM

## 2021-06-20 DIAGNOSIS — M79675 Pain in left toe(s): Secondary | ICD-10-CM | POA: Diagnosis not present

## 2021-06-20 DIAGNOSIS — M79671 Pain in right foot: Secondary | ICD-10-CM | POA: Diagnosis not present

## 2021-06-20 NOTE — Patient Instructions (Signed)
Oofos Oomg Eezee Low Shoes with stretchable uppers can be purchased online at: www.oofos.com. They can also be purchased at [facility] shoe store [facility] [employer] or [employer].  Skechers loafers with memory foam insoles and stretchable uppers can be purchased online at: [email or web address]. They can also be purchased at [employer].

## 2021-06-24 NOTE — Progress Notes (Signed)
Subjective: Shaun [name] is a pleasant 86 y.o. male patient seen today for corn(s) bilaterally , callus(es) bilaterally and painful mycotic nails.  Pain interferes with ambulation. Aggravating factors include wearing enclosed shoe gear. Painful toenails interfere with ambulation. Aggravating factors include wearing enclosed shoe gear. Pain is relieved with periodic professional debridement. Painful corns and calluses are aggravated when weightbearing with and without shoegear. Pain is relieved with periodic professional debridement.   His wife is present during today's visit. They voice no new pedal concerns on today's visit.  PCP is Creola Corn, MD. Last visit was: September, 2022.  No Known Allergies  Objective: Physical Exam  General: Shaun [name] is a pleasant 86 y.o. Caucasian male, WD, WN in NAD. AAO x 3.   Vascular:  CFT immediate b/l LE. Palpable DP/PT pulses b/l LE. Digital hair sparse b/l. Skin temperature gradient WNL b/l. No pain with calf compression b/l. No edema noted b/l. No cyanosis or clubbing noted b/l LE.   Dermatological:  Pedal skin is warm and supple b/l LE. No open wounds b/l LE. No interdigital macerations noted b/l LE. Toenails 1-5 b/l elongated, discolored, dystrophic, thickened, crumbly with subungual debris and tenderness to dorsal palpation. Hyperkeratotic lesion(s) L 4th toe, R 3rd toe, and submet head 5 b/l.  No erythema, no edema, no drainage, no fluctuance.   Musculoskeletal:  Normal muscle strength 5/5 to all lower extremity muscle groups bilaterally. No pain, crepitus or joint limitation noted with ROM bilateral LE. HAV with bunion deformity noted b/l LE. Hammertoe deformity noted 2-5 b/l.   Neurological:  Protective sensation intact 5/5 intact bilaterally with 10g monofilament b/l.   Assessment and Plan:  1. Pain due to onychomycosis of toenails of both feet   2. Corns and callosities   3. Pain in both feet     Patient was evaluated and  treated and all questions answered. Consent given for treatment as described below: -Medicare ABN on file for paring of calluses. -Mycotic toenails 1-5 bilaterally were debrided in length and girth with sterile nail nippers and dremel without incident. -Corn(s) L 4th toe and R 3rd toe and callus(es) submet head 5 b/l were pared utilizing sterile scalpel blade without incident. Total number debrided =4. -Recommended shoes with stretchable uppers and memory foam insoles such as Skechers or Oofos. -Patient/POA to call should there be question/concern in the interim.  Return in about 3 months (around 09/20/2021).  Shaun [name], DPM

## 2021-09-27 ENCOUNTER — Ambulatory Visit (INDEPENDENT_AMBULATORY_CARE_PROVIDER_SITE_OTHER): Payer: Medicare Other | Admitting: Podiatry

## 2021-09-27 ENCOUNTER — Other Ambulatory Visit: Payer: Self-pay

## 2021-09-27 ENCOUNTER — Encounter: Payer: Self-pay | Admitting: Podiatry

## 2021-09-27 DIAGNOSIS — M79675 Pain in left toe(s): Secondary | ICD-10-CM

## 2021-09-27 DIAGNOSIS — M79674 Pain in right toe(s): Secondary | ICD-10-CM | POA: Diagnosis not present

## 2021-09-27 DIAGNOSIS — L84 Corns and callosities: Secondary | ICD-10-CM | POA: Diagnosis not present

## 2021-09-27 DIAGNOSIS — B351 Tinea unguium: Secondary | ICD-10-CM

## 2021-09-27 DIAGNOSIS — M79671 Pain in right foot: Secondary | ICD-10-CM

## 2021-09-27 DIAGNOSIS — M79672 Pain in left foot: Secondary | ICD-10-CM | POA: Diagnosis not present

## 2021-10-03 NOTE — Progress Notes (Signed)
°  Subjective:  Patient ID: Shaun [name], male    DOB: 1935/06/27,  MRN: [id number]  Shaun [name] presents to clinic today for corn(s) bilateral feet , callus(es) bilateral feet and painful mycotic nails.  Pain interferes with ambulation. Aggravating factors include wearing enclosed shoe gear. Painful toenails interfere with ambulation. Aggravating factors include wearing enclosed shoe gear. Pain is relieved with periodic professional debridement. Painful corns and calluses are aggravated when weightbearing with and without shoegear. Pain is relieved with periodic professional debridement.  New problem(s): None.   PCP is [name], MD , and last visit was April 08, 2021.  No Known Allergies  Review of Systems: Negative except as noted in the HPI. Objective:   Constitutional Shaun [name] is a pleasant 87 y.o. Caucasian male, WD, WN in NAD. AAO x 3.   Vascular Capillary refill time to digits immediate b/l. Palpable pedal pulses b/l LE. Pedal hair sparse. No pain with calf compression b/l. Lower extremity skin temperature gradient within normal limits. No edema noted b/l LE. No cyanosis or clubbing noted b/l LE.  Neurologic Normal speech. Oriented to person, place, and time. Protective sensation intact 5/5 intact bilaterally with 10g monofilament b/l. Vibratory sensation intact b/l. Proprioception intact bilaterally.  Dermatologic Pedal integument with normal turgor, texture and tone b/l LE. No open wounds b/l. No interdigital macerations b/l. Toenails 1-5 b/l elongated, thickened, discolored with subungual debris. +Tenderness with dorsal palpation of nailplates. Hyperkeratotic lesion(s) noted L 4th toe, R 3rd toe, and submet head 5 b/l.  Orthopedic: Muscle strength 5/5 to all lower extremity muscle groups bilaterally. Hallux hammertoe noted b/l LE. Hammertoe deformity noted 2-5 b/l.   Radiographs: None  Last A1c: No flowsheet data found.   Assessment:   1. Pain due to  onychomycosis of toenails of both feet   2. Corns and callosities   3. Pain in both feet    Plan:  Patient was evaluated and treated and all questions answered. Consent given for treatment as described below: -Medicare ABN signed for this year. Patient consents for services of paring of corns and calluses  today. Copy has been placed in patient's chart. -Toenails 1-5 b/l were debrided in length and girth with sterile nail nippers and dremel without iatrogenic bleeding.  -Corn(s) L 4th toe and R 3rd toe and callus(es) submet head 5 b/l were pared utilizing sterile scalpel blade without incident. Total number debrided =4. -Patient/POA to call should there be question/concern in the interim.  Return in about 3 months (around 12/25/2021).  Marzetta Board, DPM

## 2021-12-18 ENCOUNTER — Emergency Department (HOSPITAL_BASED_OUTPATIENT_CLINIC_OR_DEPARTMENT_OTHER): Payer: Medicare Other

## 2021-12-18 ENCOUNTER — Emergency Department (HOSPITAL_BASED_OUTPATIENT_CLINIC_OR_DEPARTMENT_OTHER)
Admission: EM | Admit: 2021-12-18 | Discharge: 2021-12-18 | Disposition: A | Discharge status: Home / Self Care | Payer: Medicare Other | Attending: Emergency Medicine | Admitting: Emergency Medicine

## 2021-12-18 ENCOUNTER — Other Ambulatory Visit: Payer: Self-pay

## 2021-12-18 ENCOUNTER — Encounter (HOSPITAL_BASED_OUTPATIENT_CLINIC_OR_DEPARTMENT_OTHER): Payer: Self-pay | Admitting: Emergency Medicine

## 2021-12-18 DIAGNOSIS — S61412A Laceration without foreign body of left hand, initial encounter: Secondary | ICD-10-CM | POA: Diagnosis not present

## 2021-12-18 DIAGNOSIS — W19XXXA Unspecified fall, initial encounter: Secondary | ICD-10-CM | POA: Diagnosis not present

## 2021-12-18 DIAGNOSIS — S6992XA Unspecified injury of left wrist, hand and finger(s), initial encounter: Secondary | ICD-10-CM | POA: Diagnosis present

## 2021-12-18 DIAGNOSIS — S0990XA Unspecified injury of head, initial encounter: Secondary | ICD-10-CM | POA: Insufficient documentation

## 2021-12-18 HISTORY — DX: Hyperlipidemia, unspecified: E78.5

## 2021-12-18 MED ORDER — BACITRACIN ZINC 500 UNIT/GM EX OINT: TOPICAL_OINTMENT | Freq: Once | CUTANEOUS | Status: DC

## 2021-12-18 NOTE — Discharge Instructions (Signed)
Please use Tylenol or ibuprofen for pain.  You may use 600 mg ibuprofen every 6 hours or 1000 mg of Tylenol every 6 hours.  You may choose to alternate between the 2.  This would be most effective.  Not to exceed 4 g of Tylenol within 24 hours.  Not to exceed 3200 mg ibuprofen 24 hours. ? ?I recommend changing the bandage on the hand once daily, monitor for signs of infection including worsening redness, swelling, purulent drainage, decreased mobility of the wrist.  Apply thin layer of Polysporin, Neosporin, wash gently with soap and water prior to 3 bandaging every day. ?

## 2021-12-18 NOTE — ED Provider Notes (Signed)
?[facility] EMERGENCY DEPARTMENT ?Provider Note ? ? ?CSN: [id number] ?Arrival date & time: 12/18/21  1332 ? ?  ? ?History ? ?Chief Complaint  ?Patient presents with  ? Fall  ? ? ?Shaun [name] is a 87 y.o. male with a past medical history significant for aortic valve sclerosis GERD, hyperlipidemia who presents with concern for mechanical, nonsyncopal fall sustained yesterday morning.  Patient reports that he hit the left side of his hand patient, as well as left side of the head.  He denies headache, neck pain, dizziness, nausea or vomiting, vision changes.  He does not take a blood thinner.  Patient endorses skin tear on the left thenar eminence which he thought would heal on its own, wrapped up, but thinks that it may be slightly worse than he initially anticipated.  No redness, drainage, fever, chills.  Patient reports that head and neck with no pain at this time.  He denies any other injuries.  He has had no difficulty with ambulation since the fall.  He has no dizziness on my evaluation. ? ? ?Fall ? ? ?  ? ?Home Medications ?Prior to Admission medications   ?Medication Sig Start Date End Date Taking? Authorizing Provider  ?atorvastatin (LIPITOR) 10 MG tablet Take by mouth. 01/15/15   [provider]  ?esomeprazole (NEXIUM) 20 MG capsule Take by mouth.    [provider]  ?Multiple Vitamins-Minerals (MULTIVITAMIN WITH MINERALS) tablet Take 1 tablet by mouth daily.    [provider]  ?   ? ?Allergies    ?Patient has no known allergies.   ? ?Review of Systems   ?Review of Systems  ?Skin:  Positive for wound.  ?All other systems reviewed and are negative. ? ?Physical Exam ?Updated Vital Signs ?BP (!) 145/77 (BP Location: Right Arm)   Pulse (!) 53   Temp 98 ?F (36.7 ?C) (Oral)   Resp 16   SpO2 96%  ?Physical Exam ?Vitals and nursing note reviewed.  ?Constitutional:   ?   General: He is not in acute distress. ?   Appearance: Normal appearance.  ?HENT:  ?   Head: Normocephalic  and atraumatic.  ?Eyes:  ?   General:     ?   Right eye: No discharge.     ?   Left eye: No discharge.  ?Cardiovascular:  ?   Rate and Rhythm: Normal rate and regular rhythm.  ?   Heart sounds: No murmur heard. ?  No friction rub. No gallop.  ?Pulmonary:  ?   Effort: Pulmonary effort is normal.  ?   Breath sounds: Normal breath sounds.  ?Abdominal:  ?   General: Bowel sounds are normal.  ?   Palpations: Abdomen is soft.  ?Musculoskeletal:  ?   Comments: No significant TTP of cervical spine, left hand, left wrist. Intact strength 5/5 to flexion and extension of left wrist, left index finger or other digits. Patient endorses some small popping sensation of left index finger.  ?Skin: ?   General: Skin is warm and dry.  ?   Capillary Refill: Capillary refill takes less than 2 seconds.  ?   Comments: Somewhat large 4cm area of skin tear overlying thenar eminence on volar aspect of left hand. Somewhat deeper in the medial area and not covered by other skin. Some evidence of devitalized tissues. No redness, edema, purulent drainage.  ?Neurological:  ?   Mental Status: He is alert and oriented to person, place, and time.  ?Psychiatric:     ?  Mood and Affect: Mood normal.     ?   Behavior: Behavior normal.  ? ? ?ED Results / Procedures / Treatments   ?Labs ?(all labs ordered are listed, but only abnormal results are displayed) ?Labs Reviewed - No data to display ? ?EKG ?None ? ?Radiology ?CT Head Wo Contrast ? ?Result Date: 12/18/2021 ?CLINICAL DATA:  Head trauma, moderate-severe fall; Neck trauma (Age >= 65y) fall EXAM: CT HEAD WITHOUT CONTRAST CT CERVICAL SPINE WITHOUT CONTRAST TECHNIQUE: Multidetector CT imaging of the head and cervical spine was performed following the standard protocol without intravenous contrast. Multiplanar CT image reconstructions of the cervical spine were also generated. RADIATION DOSE REDUCTION: This exam was performed according to the departmental dose-optimization program which includes  automated exposure control, adjustment of the mA and/or kV according to patient size and/or use of iterative reconstruction technique. COMPARISON:  None Available. FINDINGS: CT HEAD FINDINGS Brain: No evidence of acute infarction, hemorrhage, hydrocephalus, extra-axial collection or mass lesion/mass effect. Periventricular white matter hypodensities consistent with sequela of chronic microvascular ischemic disease. Vascular: Vascular calcifications Skull: Normal. Negative for fracture or focal lesion. Sinuses/Orbits: No acute finding. Other: None. CT CERVICAL SPINE FINDINGS Alignment: Straightening of the cervical lordosis. No spondylolisthesis. Skull base and vertebrae: No acute fracture. No primary bone lesion or focal pathologic process. Soft tissues and spinal canal: No prevertebral fluid or swelling. No visible canal hematoma. Disc levels: Moderate to severe intervertebral disc space height loss throughout the cervical spine, most pronounced at C5-6 and C6-7. Multilevel facet arthropathy and uncovertebral hypertrophy results in moderate osseous neuroforaminal narrowing throughout predominantly the lower cervical spine, most pronounced at C4-5. Posterior disc osteophyte complex at C6-7 results in mild-to-moderate canal narrowing. Upper chest: Negative. Other: Vascular calcifications of bilateral carotid arteries. IMPRESSION: 1.  No acute intracranial abnormality. 2.  No acute fracture or static subluxation of the cervical spine. Electronically Signed   By: Valentino Saxon M.D.   On: 12/18/2021 14:24  ? ?CT Cervical Spine Wo Contrast ? ?Result Date: 12/18/2021 ?CLINICAL DATA:  Head trauma, moderate-severe fall; Neck trauma (Age >= 65y) fall EXAM: CT HEAD WITHOUT CONTRAST CT CERVICAL SPINE WITHOUT CONTRAST TECHNIQUE: Multidetector CT imaging of the head and cervical spine was performed following the standard protocol without intravenous contrast. Multiplanar CT image reconstructions of the cervical spine were  also generated. RADIATION DOSE REDUCTION: This exam was performed according to the departmental dose-optimization program which includes automated exposure control, adjustment of the mA and/or kV according to patient size and/or use of iterative reconstruction technique. COMPARISON:  None Available. FINDINGS: CT HEAD FINDINGS Brain: No evidence of acute infarction, hemorrhage, hydrocephalus, extra-axial collection or mass lesion/mass effect. Periventricular white matter hypodensities consistent with sequela of chronic microvascular ischemic disease. Vascular: Vascular calcifications Skull: Normal. Negative for fracture or focal lesion. Sinuses/Orbits: No acute finding. Other: None. CT CERVICAL SPINE FINDINGS Alignment: Straightening of the cervical lordosis. No spondylolisthesis. Skull base and vertebrae: No acute fracture. No primary bone lesion or focal pathologic process. Soft tissues and spinal canal: No prevertebral fluid or swelling. No visible canal hematoma. Disc levels: Moderate to severe intervertebral disc space height loss throughout the cervical spine, most pronounced at C5-6 and C6-7. Multilevel facet arthropathy and uncovertebral hypertrophy results in moderate osseous neuroforaminal narrowing throughout predominantly the lower cervical spine, most pronounced at C4-5. Posterior disc osteophyte complex at C6-7 results in mild-to-moderate canal narrowing. Upper chest: Negative. Other: Vascular calcifications of bilateral carotid arteries. IMPRESSION: 1.  No acute intracranial abnormality. 2.  No acute fracture  or static subluxation of the cervical spine. Electronically Signed   By: Valentino Saxon M.D.   On: 12/18/2021 14:24  ? ?DG Hand Complete Left ? ?Result Date: 12/18/2021 ?CLINICAL DATA:  fall EXAM: LEFT HAND - COMPLETE 3+ VIEW COMPARISON:  None Available. FINDINGS: There is a curvilinear fleck of high density along the base of the first proximal metacarpal on oblique view only. No additional  acute fracture or dislocation. No unexpected radiopaque foreign body. Overlying bandage. IMPRESSION: Curvilinear fleck of bone noted at the base of the first metacarpal on one view only is nonspecific and could ref

## 2021-12-18 NOTE — ED Triage Notes (Signed)
Pt reports mechanical fall yesterday. Pt hit left side of head and injured left hand. Left hand pressure wrapped. Denies headache, dizziness, n/v, vision changes. No blood thinners.  ?

## 2021-12-27 ENCOUNTER — Ambulatory Visit (INDEPENDENT_AMBULATORY_CARE_PROVIDER_SITE_OTHER): Payer: Medicare Other | Admitting: Podiatry

## 2021-12-27 ENCOUNTER — Encounter: Payer: Self-pay | Admitting: Podiatry

## 2021-12-27 DIAGNOSIS — R296 Repeated falls: Secondary | ICD-10-CM | POA: Insufficient documentation

## 2021-12-27 DIAGNOSIS — B351 Tinea unguium: Secondary | ICD-10-CM | POA: Diagnosis not present

## 2021-12-27 DIAGNOSIS — M79671 Pain in right foot: Secondary | ICD-10-CM

## 2021-12-27 DIAGNOSIS — L84 Corns and callosities: Secondary | ICD-10-CM

## 2021-12-27 DIAGNOSIS — M79672 Pain in left foot: Secondary | ICD-10-CM

## 2021-12-27 DIAGNOSIS — M79675 Pain in left toe(s): Secondary | ICD-10-CM | POA: Diagnosis not present

## 2021-12-27 DIAGNOSIS — M79674 Pain in right toe(s): Secondary | ICD-10-CM

## 2022-01-02 NOTE — Progress Notes (Signed)
  Subjective:  Patient ID: Shaun [name], male    DOB: [birth year],  MRN: [id number]  Shaun [name] presents to clinic today for corn(s) b/l lower extremities, callus(es) b/l lower extremities and painful mycotic nails.  Pain interferes with ambulation. Aggravating factors include wearing enclosed shoe gear. Painful toenails interfere with ambulation. Aggravating factors include wearing enclosed shoe gear. Pain is relieved with periodic professional debridement. Painful corns and calluses are aggravated when weightbearing with and without shoegear. Pain is relieved with periodic professional debridement.  New problem(s): None.   PCP is Creola Corn, MD , and last visit was Dec 23, 2021.  No Known Allergies  Review of Systems: Negative except as noted in the HPI.  Objective: No changes noted in today's physical examination. Constitutional Shaun [name] is a pleasant 87 y.o. Caucasian male, WD, WN in NAD. AAO x 3.   Vascular Capillary refill time to digits immediate b/l. Palpable pedal pulses b/l LE. Pedal hair sparse. No pain with calf compression b/l. Lower extremity skin temperature gradient within normal limits. No edema noted b/l LE. No cyanosis or clubbing noted b/l LE.  Neurologic Normal speech. Oriented to person, place, and time. Protective sensation intact 5/5 intact bilaterally with 10g monofilament b/l. Vibratory sensation intact b/l. Proprioception intact bilaterally.  Dermatologic Pedal integument with normal turgor, texture and tone b/l LE. No open wounds b/l. No interdigital macerations b/l. Toenails 1-5 b/l elongated, thickened, discolored with subungual debris. +Tenderness with dorsal palpation of nailplates. Hyperkeratotic lesion(s) noted L 4th toe, R 3rd toe, and submet head 5 b/l.  Orthopedic: Muscle strength 5/5 to all lower extremity muscle groups bilaterally. Hallux hammertoe noted b/l LE. Hammertoe deformity noted 2-5 b/l.   Radiographs: None  Assessment/Plan: 1.  Pain due to onychomycosis of toenails of both feet   2. Corns and callosities   3. Pain in both feet      -Patient was evaluated and treated. All patient's and/or POA's questions/concerns answered on today's visit. -Medicare ABN signed. Patient consents for services of paring of corn(s)/callus(es)/porokeratos(es) today. Copy in patient chart. -Mycotic toenails 1-5 bilaterally were debrided in length and girth with sterile nail nippers and dremel without incident. -Corn(s) L 4th toe and R 3rd toe and callus(es) submet head 5 b/l were pared utilizing sterile scalpel blade without incident. Total number debrided =4. -Patient/POA to call should there be question/concern in the interim.   Return in about 3 months (around 03/29/2022).  Freddie Breech, DPM

## 2022-01-27 ENCOUNTER — Other Ambulatory Visit (HOSPITAL_BASED_OUTPATIENT_CLINIC_OR_DEPARTMENT_OTHER): Payer: Self-pay | Admitting: Internal Medicine

## 2022-01-27 ENCOUNTER — Other Ambulatory Visit: Payer: Self-pay | Admitting: Internal Medicine

## 2022-01-27 DIAGNOSIS — I6523 Occlusion and stenosis of bilateral carotid arteries: Secondary | ICD-10-CM

## 2022-01-27 DIAGNOSIS — R42 Dizziness and giddiness: Secondary | ICD-10-CM

## 2022-01-28 ENCOUNTER — Ambulatory Visit (HOSPITAL_BASED_OUTPATIENT_CLINIC_OR_DEPARTMENT_OTHER)
Admission: RE | Admit: 2022-01-28 | Discharge: 2022-01-28 | Disposition: A | Discharge status: Home / Self Care | Payer: Medicare Other | Source: Ambulatory Visit | Attending: Internal Medicine | Admitting: Internal Medicine

## 2022-01-28 DIAGNOSIS — R42 Dizziness and giddiness: Secondary | ICD-10-CM | POA: Insufficient documentation

## 2022-01-28 DIAGNOSIS — I6523 Occlusion and stenosis of bilateral carotid arteries: Secondary | ICD-10-CM | POA: Insufficient documentation

## 2022-01-28 MED ORDER — GADOBUTROL 1 MMOL/ML IV SOLN
7.5000 mL | Freq: Once | INTRAVENOUS | Status: AC | PRN
  Administered 2022-01-28: 7.5 mL via INTRAVENOUS

## 2022-02-02 ENCOUNTER — Other Ambulatory Visit (HOSPITAL_COMMUNITY): Payer: Self-pay | Admitting: Internal Medicine

## 2022-02-02 DIAGNOSIS — I6523 Occlusion and stenosis of bilateral carotid arteries: Secondary | ICD-10-CM

## 2022-02-06 ENCOUNTER — Ambulatory Visit (HOSPITAL_COMMUNITY)
Admission: RE | Admit: 2022-02-06 | Discharge: 2022-02-06 | Disposition: A | Discharge status: Home / Self Care | Payer: Medicare Other | Source: Ambulatory Visit | Attending: Cardiology | Admitting: Cardiology

## 2022-02-06 DIAGNOSIS — I6523 Occlusion and stenosis of bilateral carotid arteries: Secondary | ICD-10-CM | POA: Insufficient documentation

## 2022-02-23 ENCOUNTER — Ambulatory Visit: Payer: Medicare Other | Admitting: Physical Therapy

## 2022-02-27 NOTE — Therapy (Signed)
OUTPATIENT PHYSICAL THERAPY VESTIBULAR EVALUATION     Patient Name: Shaun [name] MRN: [id number] DOB:04/03/1935, 87 y.o., male Today's Date: 03/01/2022  PCP: Creola Corn, MD REFERRING PROVIDER: Creola Corn, MD   PT End of Session - 03/01/22 1116     Visit Number 1    Number of Visits 9    Date for PT Re-Evaluation 03/29/22    Authorization Type Medicare/AARP    PT Start Time 0758    PT Stop Time 0839    PT Time Calculation (min) 41 min    Activity Tolerance Patient tolerated treatment well    Behavior During Therapy [facility] for tasks assessed/performed             Past Medical History:  Diagnosis Date   Hyperlipidemia    Past Surgical History:  Procedure Laterality Date   APPENDECTOMY     as child   CATARACT EXTRACTION, BILATERAL     HAMMER TOE SURGERY     Patient Active Problem List   Diagnosis Date Noted   Falls frequently 12/27/2021   History of colon polyps 01/15/2015   Aortic valve sclerosis 03/13/2014   Encounter for therapeutic drug level monitoring 03/13/2014   GERD (gastroesophageal reflux disease) 08/28/2011   Hypercholesteremia 08/28/2011    ONSET DATE: 12/18/21  REFERRING DIAG: R42 (ICD-10-CM) - Dizziness and giddiness  THERAPY DIAG:  BPPV (benign paroxysmal positional vertigo), right  Dizziness and giddiness  Unsteadiness on feet  Rationale for Evaluation and Treatment Rehabilitation  SUBJECTIVE:   SUBJECTIVE STATEMENT: Wife reports that patient had 2 falls within a couple days time with 1 resulting in head trauma over the L temple which occurred 1 month ago. Imaging was WNL. Dizziness started after this fall and causes balance problems. Dizziness lasts seconds and is described as woozy, lightheaded. Worse with standing and looking up and down. Reports no change since initial onset. Denies HAs or migraines. Denies infection/illness, vision changes/double vision, hearing loss, tinnitus, photo/phonophobia.  Pt accompanied by: significant  other  PERTINENT HISTORY: HLD, hammer toe surgery   PAIN:  Are you having pain? No  PRECAUTIONS: Fall  WEIGHT BEARING RESTRICTIONS No  FALLS: Has patient fallen in last 6 months? Yes. Number of falls 2  LIVING ENVIRONMENT: Lives with: lives with their spouse Lives in: House/apartment Stairs:  a few steps to enter with handrail on 1 side; lives on main floor Has following equipment at home: Single point cane, Environmental consultant - 2 wheeled, Marine scientist  PLOF: Independent  PATIENT GOALS improve dizziness  OBJECTIVE:   DIAGNOSTIC FINDINGS: 01/28/22 brain MRI: No acute or reversible finding. Mild chronic small vessel ischemia. Generalized cortical atrophy.  COGNITION: Overall cognitive status:  slightly forgetful- wife assists in subjective report    SENSATION: WFL  GAIT: Gait pattern:  slightly wider BOS, slow, and unsteadiness with transfers Assistive device utilized: None  PATIENT SURVEYS:  FOTO [phone]   VESTIBULAR ASSESSMENT   GENERAL OBSERVATION: not wearing glasses; occasionally wears readers      OCULOMOTOR EXAM:   Ocular Alignment: normal and L eyelid droop   Ocular ROM: No Limitations   Spontaneous Nystagmus: absent   Gaze-Induced Nystagmus: subtle R beating nystagmus with R gaze   Smooth Pursuits:  1-2 saccades with inferior direction   Saccades: hypometric/undershoots to R and inferior directions    Convergence/Divergence: 3 inches      VESTIBULAR - OCULAR REFLEX:    Slow VOR: Comment: slow and stopping at midline horizontal; slight difficulty maintaining gaze vertical   VOR  Cancellation: Normal   Head-Impulse Test: HIT Right: difficult to discern d/t guarding HIT Left: difficult to discern d/t guarding       POSITIONAL TESTING:  Right Roll Test: negative Left Roll Test: negative but reported mild dizziness upon rolling supine from L side Right Dix-Hallpike: R upbeating torsional nystagmus; Duration:5 sec Left Dix-Hallpike:  negative     VESTIBULAR TREATMENT:  Canalith Repositioning:   Epley Right: Number of Reps: 1, Response to Treatment: comment: unable to re-test, and Comment: tolerated well   PATIENT EDUCATION: Education details: prognosis, POC, edu on exam findings and expectations after CRM Person educated: Patient and Spouse Education method: Explanation Education comprehension: verbalized understanding   GOALS: Goals reviewed with patient? Yes  SHORT TERM GOALS: Target date: 03/15/2022  Patient to be independent with initial HEP. Baseline: HEP initiated Goal status: INITIAL    LONG TERM GOALS: Target date: 03/29/2022  Patient to be independent with advanced HEP. Baseline: Not yet initiated  Goal status: INITIAL  Patient to report 0/10 dizziness with standing quick vertical and horizontal VOR for 30 seconds. Baseline: Unable Goal status: INITIAL  Patient will report 0/10 dizziness with bed mobility.  Baseline: Symptomatic  Goal status: INITIAL  Patient to demonstrate mild-moderate sway with M-CTSIB condition with eyes closed/foam surface in order to improve safety in environments with uneven surfaces and dim lighting. Baseline: NT Goal status: INITIAL  Patient to score at least 20/24 on DGI in order to decrease risk of falls. Baseline: NT Goal status: INITIAL    ASSESSMENT:  CLINICAL IMPRESSION:   Patient is a 87 y/o M presenting to OPPT with c/o dizziness for the past month s/p fall resulting in head trauma. Dizziness lasts seconds and is described as "woozy, lightheaded." Worse with standing head nods. Denies HAs or migraines, infection/illness, vision changes/double vision, hearing loss, tinnitus, photo/phonophobia. Oculomotor exam revealed subtle R beating nystagmus with R gaze, saccades with smooth pursuits, hypometric R and inferior saccades, convergence insufficiency, slowness with VOR. Positional testing revealed positive R DH, which was treated with R Epley. Patient  tolerated this well. Despite abnormal oculomotor exam, imagining not necessary d/t patient's recent clear MRI. Would benefit from skilled PT services 1-2x/week for 4 weeks to address aforementioned impairments in order to optimize level of function.     OBJECTIVE IMPAIRMENTS Abnormal gait, decreased activity tolerance, decreased balance, decreased safety awareness, and dizziness.   ACTIVITY LIMITATIONS carrying, lifting, bending, standing, squatting, sleeping, stairs, transfers, bed mobility, bathing, dressing, and reach over head  PARTICIPATION LIMITATIONS: meal prep, laundry, driving, shopping, community activity, yard work, and church  PERSONAL FACTORS Age, Past/current experiences, Time since onset of injury/illness/exacerbation, and 1-2 comorbidities: HLD, hammer toe surgery  are also affecting patient's functional outcome.   REHAB POTENTIAL: Good  CLINICAL DECISION MAKING: Evolving/moderate complexity  EVALUATION COMPLEXITY: Moderate   PLAN: PT FREQUENCY: 1-2x/week  PT DURATION: 4 weeks  PLANNED INTERVENTIONS: Therapeutic exercises, Therapeutic activity, Neuromuscular re-education, Balance training, Gait training, Patient/Family education, Self Care, Joint mobilization, Stair training, Vestibular training, Canalith repositioning, Aquatic Therapy, Dry Needling, Electrical stimulation, Cryotherapy, Moist heat, Taping, Manual therapy, and Re-evaluation  PLAN FOR NEXT SESSION: assess DGI, MCTSIB, reassess R DH; initiate habituation or VOR if symptoms continue   Anette Guarneri, PT, DPT 03/01/22 11:24 AM  [facility] at [facility] [address] [place], [place] [address] Phone # [phone] Fax # [phone]

## 2022-03-01 ENCOUNTER — Encounter: Payer: Self-pay | Admitting: Physical Therapy

## 2022-03-01 ENCOUNTER — Other Ambulatory Visit: Payer: Self-pay

## 2022-03-01 ENCOUNTER — Ambulatory Visit: Payer: Medicare Other | Attending: Internal Medicine | Admitting: Physical Therapy

## 2022-03-01 DIAGNOSIS — H8111 Benign paroxysmal vertigo, right ear: Secondary | ICD-10-CM | POA: Diagnosis not present

## 2022-03-01 DIAGNOSIS — R42 Dizziness and giddiness: Secondary | ICD-10-CM | POA: Diagnosis present

## 2022-03-01 DIAGNOSIS — R2681 Unsteadiness on feet: Secondary | ICD-10-CM | POA: Insufficient documentation

## 2022-03-06 NOTE — Therapy (Signed)
OUTPATIENT PHYSICAL THERAPY VESTIBULAR TREATMENT     Patient Name: Shaun [name] MRN: [id number] DOB:02/10/1935, 87 y.o., male Today's Date: 03/07/2022  PCP: Creola Corn, MD REFERRING PROVIDER: Creola Corn, MD   PT End of Session - 03/07/22 [phone]     Visit Number 2    Number of Visits 9    Date for PT Re-Evaluation 03/29/22    Authorization Type Medicare/AARP    PT Start Time 0804    PT Stop Time 0841    PT Time Calculation (min) 37 min    Equipment Utilized During Treatment Gait belt    Activity Tolerance Patient tolerated treatment well    Behavior During Therapy [facility] for tasks assessed/performed              Past Medical History:  Diagnosis Date   Hyperlipidemia    Past Surgical History:  Procedure Laterality Date   APPENDECTOMY     as child   CATARACT EXTRACTION, BILATERAL     HAMMER TOE SURGERY     Patient Active Problem List   Diagnosis Date Noted   Falls frequently 12/27/2021   History of colon polyps 01/15/2015   Aortic valve sclerosis 03/13/2014   Encounter for therapeutic drug level monitoring 03/13/2014   GERD (gastroesophageal reflux disease) 08/28/2011   Hypercholesteremia 08/28/2011    ONSET DATE: 12/18/21  REFERRING DIAG: R42 (ICD-10-CM) - Dizziness and giddiness  THERAPY DIAG:  BPPV (benign paroxysmal positional vertigo), right  Dizziness and giddiness  Unsteadiness on feet  Rationale for Evaluation and Treatment Rehabilitation  SUBJECTIVE:   SUBJECTIVE STATEMENT: I don't think I'm any better, about the same. "Still not trusting myself and my gait." Wife reports having tried Epley a couple times on their own.  Pt accompanied by: significant other  PERTINENT HISTORY: HLD, hammer toe surgery   PAIN:  Are you having pain? No  PRECAUTIONS: Fall  PATIENT GOALS improve dizziness  OBJECTIVE:     TODAY'S TREATMENT: 03/07/22 Activity Comments  R DH Indiscernable nystagmus and dizziness lasting ~10 sec  R Epley Tolerated  well   R DH Torsional upbeating nystagmus lasting ~10 sec  R Epley  Tolerated well      M-CTSIB  Condition 1: Firm Surface, EO 30 Sec, Mild Sway  Condition 2: Firm Surface, EC 30 Sec, Mild Sway  Condition 3: Foam Surface, EO ~20 Sec, Moderate and Severe Sway  Condition 4: Foam Surface, EC NT d/t safety Sec,  NT  Sway      [facility] PT Assessment - 03/07/22 0001       Standardized Balance Assessment   Standardized Balance Assessment Dynamic Gait Index      Dynamic Gait Index   Level Surface Mild Impairment    Change in Gait Speed Moderate Impairment    Gait with Horizontal Head Turns Mild Impairment    Gait with Vertical Head Turns Mild Impairment    Gait and Pivot Turn Mild Impairment    Step Over Obstacle Moderate Impairment    Step Around Obstacles Severe Impairment    Steps Mild Impairment    Total Score 12             HOME EXERCISE PROGRAM Last updated: 03/07/22 Access Code: [email or web address] URL: [email or web address] Date: 03/07/2022 Prepared by: [facility] - Outpatient  Rehab - [facility]  Program Notes perform at counter top or in a corner for safety.  Exercises - Narrow Stance with Counter Support  - 1 x daily - 5 x weekly - 2 sets -  30 sec hold - Romberg Stance with Eyes Closed  - 1 x daily - 5 x weekly - 2 sets - 30 sec hold - Standing Toe Taps  - 1 x daily - 5 x weekly - 2 sets - 10 reps - Brandt-Daroff Vestibular Exercise  - 1 x daily - 5 x weekly - 2 sets - 3-5 reps   PATIENT EDUCATION: Education details: discouraged Epley at home to avoid mispositioning crystals, encouraged proper hydration and HEP with edu on safety Person educated: Patient and Spouse Education method: Explanation, Demonstration, Tactile cues, Verbal cues, and Handouts Education comprehension: verbalized understanding and returned demonstration  Below measures were taken at time of initial evaluation unless otherwise specified:  DIAGNOSTIC FINDINGS: 01/28/22 brain MRI:  No acute or reversible finding. Mild chronic small vessel ischemia. Generalized cortical atrophy.  COGNITION: Overall cognitive status:  slightly forgetful- wife assists in subjective report    SENSATION: WFL  GAIT: Gait pattern:  slightly wider BOS, slow, and unsteadiness with transfers Assistive device utilized: None  PATIENT SURVEYS:  FOTO [phone]   VESTIBULAR ASSESSMENT   GENERAL OBSERVATION: not wearing glasses; occasionally wears readers      OCULOMOTOR EXAM:   Ocular Alignment: normal and L eyelid droop   Ocular ROM: No Limitations   Spontaneous Nystagmus: absent   Gaze-Induced Nystagmus: subtle R beating nystagmus with R gaze   Smooth Pursuits:  1-2 saccades with inferior direction   Saccades: hypometric/undershoots to R and inferior directions    Convergence/Divergence: 3 inches      VESTIBULAR - OCULAR REFLEX:    Slow VOR: Comment: slow and stopping at midline horizontal; slight difficulty maintaining gaze vertical   VOR Cancellation: Normal   Head-Impulse Test: HIT Right: difficult to discern d/t guarding HIT Left: difficult to discern d/t guarding       POSITIONAL TESTING:  Right Roll Test: negative Left Roll Test: negative but reported mild dizziness upon rolling supine from L side Right Dix-Hallpike: R upbeating torsional nystagmus; Duration:5 sec Left Dix-Hallpike: negative     VESTIBULAR TREATMENT:  Canalith Repositioning:   Epley Right: Number of Reps: 1, Response to Treatment: comment: unable to re-test, and Comment: tolerated well   PATIENT EDUCATION: Education details: prognosis, POC, edu on exam findings and expectations after CRM Person educated: Patient and Spouse Education method: Explanation Education comprehension: verbalized understanding   GOALS: Goals reviewed with patient? Yes  SHORT TERM GOALS: Target date: 03/15/2022  Patient to be independent with initial HEP. Baseline: HEP initiated Goal status: IN PROGRESS    LONG  TERM GOALS: Target date: 03/29/2022  Patient to be independent with advanced HEP. Baseline: Not yet initiated  Goal status: IN PROGRESS  Patient to report 0/10 dizziness with standing quick vertical and horizontal VOR for 30 seconds. Baseline: Unable Goal status: IN PROGRESS  Patient will report 0/10 dizziness with bed mobility.  Baseline: Symptomatic  Goal status: IN PROGRESS  Patient to demonstrate mild-moderate sway with M-CTSIB condition with eyes closed/foam surface in order to improve safety in environments with uneven surfaces and dim lighting. Baseline: NT Goal status: IN PROGRESS  Patient to score at least 20/24 on DGI in order to decrease risk of falls. Baseline: NT Goal status: IN PROGRESS    ASSESSMENT:  CLINICAL IMPRESSION:  Patient arrived to session with report of no improvement in symptoms since last session. Patient demonstrated imbalance with balance on firm and foam surface with narrow BOS. Patient scored 12/24 on DGI, indicating increased risk of falls. Provided HEP to  address these deficits safely. Positional testing still indicates R posterior canalithiasis, which was treated with Epley x2. Patient tolerated well but not convinced that BPPV is resolved. No complaints at end of session.    OBJECTIVE IMPAIRMENTS Abnormal gait, decreased activity tolerance, decreased balance, decreased safety awareness, and dizziness.   ACTIVITY LIMITATIONS carrying, lifting, bending, standing, squatting, sleeping, stairs, transfers, bed mobility, bathing, dressing, and reach over head  PARTICIPATION LIMITATIONS: meal prep, laundry, driving, shopping, community activity, yard work, and church  PERSONAL FACTORS Age, Past/current experiences, Time since onset of injury/illness/exacerbation, and 1-2 comorbidities: HLD, hammer toe surgery  are also affecting patient's functional outcome.   REHAB POTENTIAL: Good  CLINICAL DECISION MAKING: Evolving/moderate complexity  EVALUATION  COMPLEXITY: Moderate   PLAN: PT FREQUENCY: 1-2x/week  PT DURATION: 4 weeks  PLANNED INTERVENTIONS: Therapeutic exercises, Therapeutic activity, Neuromuscular re-education, Balance training, Gait training, Patient/Family education, Self Care, Joint mobilization, Stair training, Vestibular training, Canalith repositioning, Aquatic Therapy, Dry Needling, Electrical stimulation, Cryotherapy, Moist heat, Taping, Manual therapy, and Re-evaluation  PLAN FOR NEXT SESSION: reassess R DH; initiate habituation or VOR if symptoms continue   Janene Harvey, PT, DPT 03/07/22 8:48 AM  [facility] at [facility] [address], [place], [place] [address] Phone # [phone] Fax # [phone]

## 2022-03-07 ENCOUNTER — Encounter: Payer: Self-pay | Admitting: Physical Therapy

## 2022-03-07 ENCOUNTER — Ambulatory Visit: Payer: Medicare Other | Attending: Internal Medicine | Admitting: Physical Therapy

## 2022-03-07 DIAGNOSIS — R2681 Unsteadiness on feet: Secondary | ICD-10-CM | POA: Insufficient documentation

## 2022-03-07 DIAGNOSIS — R42 Dizziness and giddiness: Secondary | ICD-10-CM | POA: Insufficient documentation

## 2022-03-07 DIAGNOSIS — H8111 Benign paroxysmal vertigo, right ear: Secondary | ICD-10-CM | POA: Diagnosis not present

## 2022-03-08 NOTE — Therapy (Signed)
OUTPATIENT PHYSICAL THERAPY VESTIBULAR TREATMENT     Patient Name: Shaun [name] MRN: [id number] DOB:24-Jun-1935, 87 y.o., male Today's Date: 03/09/2022  PCP: [name], MD REFERRING PROVIDER: [name], MD   PT End of Session - 03/09/22 [phone]     Visit Number 3    Number of Visits 9    Date for PT Re-Evaluation 03/29/22    Authorization Type Medicare/[employer]    PT Start Time 0853    PT Stop Time 0928    PT Time Calculation (min) 35 min    Equipment Utilized During Treatment Gait belt    Activity Tolerance Patient tolerated treatment well    Behavior During Therapy [facility] for tasks assessed/performed               Past Medical History:  Diagnosis Date   Hyperlipidemia    Past Surgical History:  Procedure Laterality Date   APPENDECTOMY     as child   CATARACT EXTRACTION, BILATERAL     HAMMER TOE SURGERY     Patient Active Problem List   Diagnosis Date Noted   Falls frequently 12/27/2021   History of colon polyps 01/15/2015   Aortic valve sclerosis 03/13/2014   Encounter for therapeutic drug level monitoring 03/13/2014   GERD (gastroesophageal reflux disease) 08/28/2011   Hypercholesteremia 08/28/2011    ONSET DATE: 12/18/21  REFERRING DIAG: R42 (ICD-10-CM) - Dizziness and giddiness  THERAPY DIAG:  BPPV (benign paroxysmal positional vertigo), right  Dizziness and giddiness  Unsteadiness on feet  Rationale for Evaluation and Treatment Rehabilitation  SUBJECTIVE:   SUBJECTIVE STATEMENT: I think I can see some improvements. Notes progressing balance and dizziness, but still experiencing dizziness.   Pt accompanied by: significant other  PERTINENT HISTORY: HLD, hammer toe surgery   PAIN:  Are you having pain? No  PRECAUTIONS: Fall  PATIENT GOALS improve dizziness  OBJECTIVE:    TODAY'S TREATMENT: 03/09/22 Activity Comments  Romberg 30"  Mild sway   romberg EC 30" Mild-mod sway  Romberg with EC head nods/turns x 1 min each Posterior  LOB with neck extension   Standing quick head turns/nods to targets 30" No dizziness   1/2 turns to targets  Short step length and somewhat slow pace; c/o mild dizziness B  alt toe tap + intermittently leaning forward to retrieve cone  CGA; intermittent LOB with self-correction   tandem walk with 2 finger support 6x Cues for tight core and tall posture   R [facility] Negative       HOME EXERCISE PROGRAM Last updated: 03/09/22 Access Code: [email or web address] URL: [email or web address] Date: 03/09/2022 Prepared by: [facility]  Program Notes perform at counter top or in a corner for safety.  Exercises - Narrow Stance with Counter Support  - 1 x daily - 5 x weekly - 2 sets - 30 sec hold - Romberg Stance with Eyes Closed  - 1 x daily - 5 x weekly - 2 sets - 30 sec hold - Standing Toe Taps  - 1 x daily - 5 x weekly - 2 sets - 10 reps - Romberg Stance with Head Nods  - 1 x daily - 5 x weekly - 2 sets - 30 sec hold - 180 Degree Pivot Turn with Single Point Cane  - 1 x daily - 5 x weekly - 2 sets - 10 reps    PATIENT EDUCATION: Education details: HEP update with specific edu on safety Person educated: Patient and Spouse Education method: Explanation,  Demonstration, Tactile cues, Verbal cues, and Handouts Education comprehension: verbalized understanding and returned demonstration    Below measures were taken at time of initial evaluation unless otherwise specified:  DIAGNOSTIC FINDINGS: 01/28/22 brain MRI: No acute or reversible finding. Mild chronic small vessel ischemia. Generalized cortical atrophy.  COGNITION: Overall cognitive status:  slightly forgetful- wife assists in subjective report    SENSATION: WFL  GAIT: Gait pattern:  slightly wider BOS, slow, and unsteadiness with transfers Assistive device utilized: None  PATIENT SURVEYS:  FOTO [phone]   VESTIBULAR ASSESSMENT   GENERAL OBSERVATION: not wearing glasses; occasionally  wears readers      OCULOMOTOR EXAM:   Ocular Alignment: normal and L eyelid droop   Ocular ROM: No Limitations   Spontaneous Nystagmus: absent   Gaze-Induced Nystagmus: subtle R beating nystagmus with R gaze   Smooth Pursuits:  1-2 saccades with inferior direction   Saccades: hypometric/undershoots to R and inferior directions    Convergence/Divergence: 3 inches      VESTIBULAR - OCULAR REFLEX:    Slow VOR: Comment: slow and stopping at midline horizontal; slight difficulty maintaining gaze vertical   VOR Cancellation: Normal   Head-Impulse Test: HIT Right: difficult to discern d/t guarding HIT Left: difficult to discern d/t guarding       POSITIONAL TESTING:  Right Roll Test: negative Left Roll Test: negative but reported mild dizziness upon rolling supine from L side Right Dix-Hallpike: R upbeating torsional nystagmus; Duration:5 sec Left Dix-Hallpike: negative     VESTIBULAR TREATMENT:  Canalith Repositioning:   Epley Right: Number of Reps: 1, Response to Treatment: comment: unable to re-test, and Comment: tolerated well   PATIENT EDUCATION: Education details: prognosis, POC, edu on exam findings and expectations after CRM Person educated: Patient and Spouse Education method: Explanation Education comprehension: verbalized understanding   GOALS: Goals reviewed with patient? Yes  SHORT TERM GOALS: Target date: 03/15/2022  Patient to be independent with initial HEP. Baseline: HEP initiated Goal status: IN PROGRESS    LONG TERM GOALS: Target date: 03/29/2022  Patient to be independent with advanced HEP. Baseline: Not yet initiated  Goal status: IN PROGRESS  Patient to report 0/10 dizziness with standing quick vertical and horizontal VOR for 30 seconds. Baseline: Unable Goal status: IN PROGRESS  Patient will report 0/10 dizziness with bed mobility.  Baseline: Symptomatic  Goal status: IN PROGRESS  Patient to demonstrate mild-moderate sway with M-CTSIB  condition with eyes closed/foam surface in order to improve safety in environments with uneven surfaces and dim lighting. Baseline: NT Goal status: IN PROGRESS  Patient to score at least 20/24 on DGI in order to decrease risk of falls. Baseline: NT Goal status: IN PROGRESS    ASSESSMENT:  CLINICAL IMPRESSION: Patient arrived to session with report of improving dizziness and balance. Worked on continuation of balance activities, today with addition of head movements to further challenge vestibular system. Most activities were tolerated without c/o dizziness; only with c/o mild symptoms with quick body turns. Reassessment of positional vertigo was negative today. Patient and wife reported understanding of HEP update and without complaints at end of session.    OBJECTIVE IMPAIRMENTS Abnormal gait, decreased activity tolerance, decreased balance, decreased safety awareness, and dizziness.   ACTIVITY LIMITATIONS carrying, lifting, bending, standing, squatting, sleeping, stairs, transfers, bed mobility, bathing, dressing, and reach over head  PARTICIPATION LIMITATIONS: meal prep, laundry, driving, shopping, community activity, yard work, and church  PERSONAL FACTORS Age, Past/current experiences, Time since onset of injury/illness/exacerbation, and 1-2 comorbidities: HLD, hammer  toe surgery  are also affecting patient's functional outcome.   REHAB POTENTIAL: Good  CLINICAL DECISION MAKING: Evolving/moderate complexity  EVALUATION COMPLEXITY: Moderate   PLAN: PT FREQUENCY: 1-2x/week  PT DURATION: 4 weeks  PLANNED INTERVENTIONS: Therapeutic exercises, Therapeutic activity, Neuromuscular re-education, Balance training, Gait training, Patient/Family education, Self Care, Joint mobilization, Stair training, Vestibular training, Canalith repositioning, Aquatic Therapy, Dry Needling, Electrical stimulation, Cryotherapy, Moist heat, Taping, Manual therapy, and Re-evaluation  PLAN FOR NEXT  SESSION: progress balance training   Anette Guarneri, PT, DPT 03/09/22 9:32 AM  [facility] [address], [address] [place], [place] [address] Phone # [phone] Fax # [phone]

## 2022-03-09 ENCOUNTER — Encounter: Payer: Self-pay | Admitting: Physical Therapy

## 2022-03-09 ENCOUNTER — Ambulatory Visit: Payer: Medicare Other | Admitting: Physical Therapy

## 2022-03-09 DIAGNOSIS — H8111 Benign paroxysmal vertigo, right ear: Secondary | ICD-10-CM

## 2022-03-09 DIAGNOSIS — R2681 Unsteadiness on feet: Secondary | ICD-10-CM

## 2022-03-09 DIAGNOSIS — R42 Dizziness and giddiness: Secondary | ICD-10-CM

## 2022-03-14 ENCOUNTER — Ambulatory Visit: Payer: Medicare Other | Admitting: Physical Therapy

## 2022-03-15 NOTE — Therapy (Signed)
OUTPATIENT PHYSICAL THERAPY VESTIBULAR TREATMENT     Patient Name: Shaun [name] MRN: [id number] DOB:September 18, 1934, 87 y.o., male Today's Date: 03/15/2022  PCP: Creola Corn, MD REFERRING PROVIDER: Creola Corn, MD       Past Medical History:  Diagnosis Date   Hyperlipidemia    Past Surgical History:  Procedure Laterality Date   APPENDECTOMY     as child   CATARACT EXTRACTION, BILATERAL     HAMMER TOE SURGERY     Patient Active Problem List   Diagnosis Date Noted   Falls frequently 12/27/2021   History of colon polyps 01/15/2015   Aortic valve sclerosis 03/13/2014   Encounter for therapeutic drug level monitoring 03/13/2014   GERD (gastroesophageal reflux disease) 08/28/2011   Hypercholesteremia 08/28/2011    ONSET DATE: 12/18/21  REFERRING DIAG: R42 (ICD-10-CM) - Dizziness and giddiness  THERAPY DIAG:  No diagnosis found.  Rationale for Evaluation and Treatment Rehabilitation  SUBJECTIVE:   SUBJECTIVE STATEMENT: I think I can see some improvements. Notes progressing balance and dizziness, but still experiencing dizziness.   Pt accompanied by: significant other  PERTINENT HISTORY: HLD, hammer toe surgery   PAIN:  Are you having pain? No  PRECAUTIONS: Fall  PATIENT GOALS improve dizziness  OBJECTIVE:     TODAY'S TREATMENT: 03/16/22 Activity Comments                       TODAY'S TREATMENT: 03/09/22 Activity Comments  Romberg 30"  Mild sway   romberg EC 30" Mild-mod sway  Romberg with EC head nods/turns x 1 min each Posterior LOB with neck extension   Standing quick head turns/nods to targets 30" No dizziness   1/2 turns to targets  Short step length and somewhat slow pace; c/o mild dizziness B  alt toe tap + intermittently leaning forward to retrieve cone  CGA; intermittent LOB with self-correction   tandem walk with 2 finger support 6x Cues for tight core and tall posture   R [facility] Negative       HOME EXERCISE PROGRAM Last  updated: 03/09/22 Access Code: [email or web address] URL: [email or web address] Date: 03/09/2022 Prepared by: [facility]  Rehab - [facility]  Program Notes perform at counter top or in a corner for safety.  Exercises - Narrow Stance with Counter Support  - 1 x daily - 5 x weekly - 2 sets - 30 sec hold - Romberg Stance with Eyes Closed  - 1 x daily - 5 x weekly - 2 sets - 30 sec hold - Standing Toe Taps  - 1 x daily - 5 x weekly - 2 sets - 10 reps - Romberg Stance with Head Nods  - 1 x daily - 5 x weekly - 2 sets - 30 sec hold - 180 Degree Pivot Turn with Single Point Cane  - 1 x daily - 5 x weekly - 2 sets - 10 reps    PATIENT EDUCATION: Education details: HEP update with specific edu on safety Person educated: Patient and Spouse Education method: Explanation, Demonstration, Tactile cues, Verbal cues, and Handouts Education comprehension: verbalized understanding and returned demonstration    Below measures were taken at time of initial evaluation unless otherwise specified:  DIAGNOSTIC FINDINGS: 01/28/22 brain MRI: No acute or reversible finding. Mild chronic small vessel ischemia. Generalized cortical atrophy.  COGNITION: Overall cognitive status:  slightly forgetful- wife assists in subjective report    SENSATION: WFL  GAIT: Gait pattern:  slightly wider BOS, slow, and unsteadiness with transfers  Assistive device utilized: None  PATIENT SURVEYS:  FOTO [phone]   VESTIBULAR ASSESSMENT   GENERAL OBSERVATION: not wearing glasses; occasionally wears readers      OCULOMOTOR EXAM:   Ocular Alignment: normal and L eyelid droop   Ocular ROM: No Limitations   Spontaneous Nystagmus: absent   Gaze-Induced Nystagmus: subtle R beating nystagmus with R gaze   Smooth Pursuits:  1-2 saccades with inferior direction   Saccades: hypometric/undershoots to R and inferior directions    Convergence/Divergence: 3 inches      VESTIBULAR - OCULAR REFLEX:    Slow  VOR: Comment: slow and stopping at midline horizontal; slight difficulty maintaining gaze vertical   VOR Cancellation: Normal   Head-Impulse Test: HIT Right: difficult to discern d/t guarding HIT Left: difficult to discern d/t guarding       POSITIONAL TESTING:  Right Roll Test: negative Left Roll Test: negative but reported mild dizziness upon rolling supine from L side Right Dix-Hallpike: R upbeating torsional nystagmus; Duration:5 sec Left Dix-Hallpike: negative     VESTIBULAR TREATMENT:  Canalith Repositioning:   Epley Right: Number of Reps: 1, Response to Treatment: comment: unable to re-test, and Comment: tolerated well   PATIENT EDUCATION: Education details: prognosis, POC, edu on exam findings and expectations after CRM Person educated: Patient and Spouse Education method: Explanation Education comprehension: verbalized understanding   GOALS: Goals reviewed with patient? Yes  SHORT TERM GOALS: Target date: 03/15/2022  Patient to be independent with initial HEP. Baseline: HEP initiated Goal status: IN PROGRESS    LONG TERM GOALS: Target date: 03/29/2022  Patient to be independent with advanced HEP. Baseline: Not yet initiated  Goal status: IN PROGRESS  Patient to report 0/10 dizziness with standing quick vertical and horizontal VOR for 30 seconds. Baseline: Unable Goal status: IN PROGRESS  Patient will report 0/10 dizziness with bed mobility.  Baseline: Symptomatic  Goal status: IN PROGRESS  Patient to demonstrate mild-moderate sway with M-CTSIB condition with eyes closed/foam surface in order to improve safety in environments with uneven surfaces and dim lighting. Baseline: NT Goal status: IN PROGRESS  Patient to score at least 20/24 on DGI in order to decrease risk of falls. Baseline: NT Goal status: IN PROGRESS    ASSESSMENT:  CLINICAL IMPRESSION: Patient arrived to session with report of improving dizziness and balance. Worked on continuation  of balance activities, today with addition of head movements to further challenge vestibular system. Most activities were tolerated without c/o dizziness; only with c/o mild symptoms with quick body turns. Reassessment of positional vertigo was negative today. Patient and wife reported understanding of HEP update and without complaints at end of session.    OBJECTIVE IMPAIRMENTS Abnormal gait, decreased activity tolerance, decreased balance, decreased safety awareness, and dizziness.   ACTIVITY LIMITATIONS carrying, lifting, bending, standing, squatting, sleeping, stairs, transfers, bed mobility, bathing, dressing, and reach over head  PARTICIPATION LIMITATIONS: meal prep, laundry, driving, shopping, community activity, yard work, and church  PERSONAL FACTORS Age, Past/current experiences, Time since onset of injury/illness/exacerbation, and 1-2 comorbidities: HLD, hammer toe surgery  are also affecting patient's functional outcome.   REHAB POTENTIAL: Good  CLINICAL DECISION MAKING: Evolving/moderate complexity  EVALUATION COMPLEXITY: Moderate   PLAN: PT FREQUENCY: 1-2x/week  PT DURATION: 4 weeks  PLANNED INTERVENTIONS: Therapeutic exercises, Therapeutic activity, Neuromuscular re-education, Balance training, Gait training, Patient/Family education, Self Care, Joint mobilization, Stair training, Vestibular training, Canalith repositioning, Aquatic Therapy, Dry Needling, Electrical stimulation, Cryotherapy, Moist heat, Taping, Manual therapy, and Re-evaluation  PLAN FOR NEXT SESSION:  progress balance training   Anette Guarneri, PT, DPT 03/15/22 10:04 AM  [facility] at [facility] [address] [place], [address] [place], [place] [address] Phone # [phone] Fax # [phone]

## 2022-03-16 ENCOUNTER — Ambulatory Visit: Payer: Medicare Other | Admitting: Physical Therapy

## 2022-03-16 ENCOUNTER — Encounter: Payer: Self-pay | Admitting: Physical Therapy

## 2022-03-16 DIAGNOSIS — H8111 Benign paroxysmal vertigo, right ear: Secondary | ICD-10-CM | POA: Diagnosis not present

## 2022-03-16 DIAGNOSIS — R2681 Unsteadiness on feet: Secondary | ICD-10-CM

## 2022-03-16 DIAGNOSIS — R42 Dizziness and giddiness: Secondary | ICD-10-CM

## 2022-03-21 ENCOUNTER — Ambulatory Visit: Payer: Medicare Other | Admitting: Physical Therapy

## 2022-03-22 ENCOUNTER — Ambulatory Visit: Payer: Medicare Other | Admitting: Physical Therapy

## 2022-03-23 ENCOUNTER — Ambulatory Visit: Payer: Medicare Other | Admitting: Physical Therapy

## 2022-03-28 NOTE — Therapy (Signed)
OUTPATIENT PHYSICAL THERAPY VESTIBULAR TREATMENT     Patient Name: Shaun [name] MRN: [id number] DOB:1935-06-12, 87 y.o., male Today's Date: 03/29/2022  PCP: [name], MD REFERRING PROVIDER: [name], MD   PT End of Session - 03/29/22 1019     Visit Number 5    Number of Visits 11    Date for PT Re-Evaluation 05/10/22    Authorization Type Medicare/AARP    PT Start Time 0852    PT Stop Time 0928    PT Time Calculation (min) 36 min    Equipment Utilized During Treatment Gait belt    Activity Tolerance Patient tolerated treatment well    Behavior During Therapy [facility] for tasks assessed/performed                 Past Medical History:  Diagnosis Date   Hyperlipidemia    Past Surgical History:  Procedure Laterality Date   APPENDECTOMY     as child   CATARACT EXTRACTION, BILATERAL     HAMMER TOE SURGERY     Patient Active Problem List   Diagnosis Date Noted   Falls frequently 12/27/2021   History of colon polyps 01/15/2015   Aortic valve sclerosis 03/13/2014   Encounter for therapeutic drug level monitoring 03/13/2014   GERD (gastroesophageal reflux disease) 08/28/2011   Hypercholesteremia 08/28/2011    ONSET DATE: 12/18/21  REFERRING DIAG: R42 (ICD-10-CM) - Dizziness and giddiness  THERAPY DIAG:  BPPV (benign paroxysmal positional vertigo), right  Dizziness and giddiness  Unsteadiness on feet  Rationale for Evaluation and Treatment Rehabilitation  SUBJECTIVE:   SUBJECTIVE STATEMENT: Was sick for 2 appointments- feeling better now. Dizziness has not been an issue; balance is questionable. Occasionally working on his HEP. Denies questions.  Pt accompanied by: significant other  PERTINENT HISTORY: HLD, hammer toe surgery   PAIN:  Are you having pain? No  PRECAUTIONS: Fall  PATIENT GOALS improve dizziness  OBJECTIVE:     TODAY'S TREATMENT: 03/29/22 Activity Comments  Simulation of bed mobility inculding sit>R sidelying>supine>L  sidelying>sit  No dizziness   Standing horizontal VOR 30" No dizziness; cues to maintain gaze stability   Standing vertical VOR 30" No dizziness  Romberg EO/EC head turns  Mild-mod sway  Standing wide on foam + head turns  Fairly good stability  Marching on foam Cues to be mindful of foot positioning    M-CTSIB  Condition 1: Firm Surface, EO 30 Sec, Normal Sway  Condition 2: Firm Surface, EC 30 Sec, Mild Sway  Condition 3: Foam Surface, EO 30 Sec, Mild and Moderate Sway  Condition 4: Foam Surface, EC 10 Sec, Severe Sway     [facility] PT Assessment - 03/29/22 0001       Dynamic Gait Index   Level Surface Normal    Change in Gait Speed Normal    Gait with Horizontal Head Turns Moderate Impairment    Gait with Vertical Head Turns Mild Impairment    Gait and Pivot Turn Normal    Step Over Obstacle Normal    Step Around Obstacles Severe Impairment    Steps Mild Impairment    Total Score 17             HOME EXERCISE PROGRAM Last updated: 03/29/22 Access Code: [email or web address] URL: [email or web address] Date: 03/29/2022 Prepared by: [facility]  Program Notes perform at counter top or in a corner for safety.  Exercises - Standing Toe Taps  - 1 x daily - 5 x weekly -  2 sets - 10 reps - Romberg Stance with Head Nods  - 1 x daily - 5 x weekly - 2 sets - 30 sec hold - Romberg Stance with Head Rotation  - 1 x daily - 5 x weekly - 2 sets - 30 sec hold - 180 Degree Pivot Turn with Single Point Cane  - 1 x daily - 5 x weekly - 2 sets - 10 reps - Romberg Stance on Foam Pad  - 1 x daily - 5 x weekly - 2-3 sets - 30 sec hold - Standing March with Counter Support  - 1 x daily - 5 x weekly - 2 sets - 30 sec hold   PATIENT EDUCATION: Education details: exam findings, HEP review and update, edu on silver sneakers and tai chi classes to address fitness and balance Person educated: Patient and Spouse Education method: Explanation, Demonstration,  Tactile cues, Verbal cues, and Handouts Education comprehension: verbalized understanding and returned demonstration   Below measures were taken at time of initial evaluation unless otherwise specified:  DIAGNOSTIC FINDINGS: 01/28/22 brain MRI: No acute or reversible finding. Mild chronic small vessel ischemia. Generalized cortical atrophy.  COGNITION: Overall cognitive status:  slightly forgetful- wife assists in subjective report    SENSATION: WFL  GAIT: Gait pattern:  slightly wider BOS, slow, and unsteadiness with transfers Assistive device utilized: None  PATIENT SURVEYS:  FOTO [phone]   VESTIBULAR ASSESSMENT   GENERAL OBSERVATION: not wearing glasses; occasionally wears readers      OCULOMOTOR EXAM:   Ocular Alignment: normal and L eyelid droop   Ocular ROM: No Limitations   Spontaneous Nystagmus: absent   Gaze-Induced Nystagmus: subtle R beating nystagmus with R gaze   Smooth Pursuits:  1-2 saccades with inferior direction   Saccades: hypometric/undershoots to R and inferior directions    Convergence/Divergence: 3 inches      VESTIBULAR - OCULAR REFLEX:    Slow VOR: Comment: slow and stopping at midline horizontal; slight difficulty maintaining gaze vertical   VOR Cancellation: Normal   Head-Impulse Test: HIT Right: difficult to discern d/t guarding HIT Left: difficult to discern d/t guarding       POSITIONAL TESTING:  Right Roll Test: negative Left Roll Test: negative but reported mild dizziness upon rolling supine from L side Right Dix-Hallpike: R upbeating torsional nystagmus; Duration:5 sec Left Dix-Hallpike: negative     VESTIBULAR TREATMENT:  Canalith Repositioning:   Epley Right: Number of Reps: 1, Response to Treatment: comment: unable to re-test, and Comment: tolerated well   PATIENT EDUCATION: Education details: prognosis, POC, edu on exam findings and expectations after CRM Person educated: Patient and Spouse Education method:  Explanation Education comprehension: verbalized understanding   GOALS: Goals reviewed with patient? Yes  SHORT TERM GOALS: Target date: 03/15/2022  Patient to be independent with initial HEP. Baseline: HEP initiated Goal status: MET 03/29/22    LONG TERM GOALS: Target date: 05/10/2022  Patient to be independent with advanced HEP. Baseline: Not yet initiated  Goal status: IN PROGRESS   Patient to report 0/10 dizziness with standing quick vertical and horizontal VOR for 30 seconds. Baseline: Unable Goal status: MET 03/29/22  Patient will report 0/10 dizziness with bed mobility.  Baseline: Symptomatic  Goal status: MET 03/29/22  Patient to demonstrate mild-moderate sway with M-CTSIB condition with eyes closed/foam surface in order to improve safety in environments with uneven surfaces and dim lighting. Baseline: NT; 10 sec with severe sway 03/29/22 Goal status: IN PROGRESS  Patient to score at least  20/24 on DGI in order to decrease risk of falls. Baseline: 17/24 03/29/22 Goal status: IN PROGRESS    ASSESSMENT:  CLINICAL IMPRESSION: Patient arrived to session with report of feeling sick for the past week or so; feeing better now. Notes no problems with dizziness but still noting some imbalance. Patient reported no dizziness with VOR or simulation of bed mobility today. Balance on foam has improved considerably; patient able to tolerate 10 sec of EC/foam balance whereas this was not tested initially d/t safety. Still with LOB posteriorly on foam, required increased practice. Patient scored 17/24 on DGI, indicating increased risk of falls but much improved compared to initial measurement. Reviewed and updated HEP for max benefit and educated on fitness classes for maintenance. Patient would benefit from additional skilled PT services 1x/week for 6 weeks to address falls risk.     OBJECTIVE IMPAIRMENTS Abnormal gait, decreased activity tolerance, decreased balance, decreased  safety awareness, and dizziness.   ACTIVITY LIMITATIONS carrying, lifting, bending, standing, squatting, sleeping, stairs, transfers, bed mobility, bathing, dressing, and reach over head  PARTICIPATION LIMITATIONS: meal prep, laundry, driving, shopping, community activity, yard work, and church  PERSONAL FACTORS Age, Past/current experiences, Time since onset of injury/illness/exacerbation, and 1-2 comorbidities: HLD, hammer toe surgery  are also affecting patient's functional outcome.   REHAB POTENTIAL: Good  CLINICAL DECISION MAKING: Evolving/moderate complexity  EVALUATION COMPLEXITY: Moderate   PLAN: PT FREQUENCY: 1x/week  PT DURATION: 6 weeks  PLANNED INTERVENTIONS: Therapeutic exercises, Therapeutic activity, Neuromuscular re-education, Balance training, Gait training, Patient/Family education, Self Care, Joint mobilization, Stair training, Vestibular training, Canalith repositioning, Aquatic Therapy, Dry Needling, Electrical stimulation, Cryotherapy, Moist heat, Taping, Manual therapy, and Re-evaluation  PLAN FOR NEXT SESSION: balance training including turns, stairs, walking with head turns/nods, compliance surface and EC  Janene Harvey, PT, DPT 03/29/22 10:24 AM  [facility] [address] [place], [place] [address] Phone # [phone] Fax # [phone]

## 2022-03-29 ENCOUNTER — Encounter: Payer: Self-pay | Admitting: Physical Therapy

## 2022-03-29 ENCOUNTER — Ambulatory Visit: Payer: Medicare Other | Admitting: Physical Therapy

## 2022-03-29 DIAGNOSIS — H8111 Benign paroxysmal vertigo, right ear: Secondary | ICD-10-CM

## 2022-03-29 DIAGNOSIS — R2681 Unsteadiness on feet: Secondary | ICD-10-CM

## 2022-03-29 DIAGNOSIS — R42 Dizziness and giddiness: Secondary | ICD-10-CM

## 2022-04-03 ENCOUNTER — Encounter: Payer: Self-pay | Admitting: Podiatrist

## 2022-04-03 ENCOUNTER — Ambulatory Visit (INDEPENDENT_AMBULATORY_CARE_PROVIDER_SITE_OTHER): Payer: Medicare Other | Admitting: Podiatrist

## 2022-04-03 DIAGNOSIS — M79675 Pain in left toe(s): Secondary | ICD-10-CM

## 2022-04-03 DIAGNOSIS — L84 Corns and callosities: Secondary | ICD-10-CM | POA: Diagnosis not present

## 2022-04-03 DIAGNOSIS — M79674 Pain in right toe(s): Secondary | ICD-10-CM

## 2022-04-03 DIAGNOSIS — B351 Tinea unguium: Secondary | ICD-10-CM

## 2022-04-03 NOTE — Progress Notes (Signed)
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  No symptomatic callusses noted submetatarsal 5 today. Painful toenails interfere with ambulation. Aggravating factors include wearing enclosed shoe gear. Pain is relieved with periodic professional debridement. Painful corns aggravated when weightbearing with and without shoegear. Pain is relieved with periodic professional debridement.   New problem(s): None.    PCP is Shaun Corn, MD , and last visit was Dec 23, 2021.   No Known Allergies   Review of Systems: Negative except as noted in the HPI.   Objective: No changes noted in today's physical examination.   Constitutional Shaun [name] is a pleasant 87 y.o. Caucasian male, WD, WN in NAD. AAO x 3.   Vascular Capillary refill time to digits immediate b/l. Palpable pedal pulses b/l LE. Pedal hair sparse. No pain with calf compression b/l. Lower extremity skin temperature gradient within normal limits. No edema noted b/l LE. No cyanosis or clubbing noted b/l LE.  Neurologic Normal speech. Oriented to person, place, and time. Protective sensation intact 5/5 intact bilaterally with 10g monofilament b/l. Vibratory sensation intact b/l. Proprioception intact bilaterally.  Dermatologic Pedal integument with normal turgor, texture and tone b/l LE. No open wounds b/l. No interdigital macerations b/l. Toenails 1 b/l elongated, thickened, discolored with subungual debris. +Tenderness with dorsal palpation of nailplates. Hyperkeratotic lesion(s) noted B/L 5th toe, distal tip of  R 3rd toe  Orthopedic: Muscle strength 5/5 to all lower extremity muscle groups bilaterally. Hallux hammertoe noted b/l LE. Hammertoe deformity noted 2-5 b/l.   Radiographs: None   Assessment/Plan: 1. Pain due  to onychomycosis of toenails of both feet   2. Corns and callosities   3. Pain in both feet       -Patient was evaluated and treated.  -Mycotic toenails 1-5 bilaterally were debrided in length and girth with sterile nail nippers and dremel without incident. -[name](s) L 5th toe and R 3rd toe were pared utilizing sterile scalpel blade without incident. Total number debrided =2 -Patient/POA to call should there be question/concern in the interim.    Return in about 3 months

## 2022-04-04 ENCOUNTER — Ambulatory Visit: Payer: Medicare Other

## 2022-04-11 ENCOUNTER — Encounter: Payer: Self-pay | Admitting: Physical Therapy

## 2022-04-11 ENCOUNTER — Ambulatory Visit: Payer: Medicare Other | Attending: Internal Medicine | Admitting: Physical Therapy

## 2022-04-11 DIAGNOSIS — R2681 Unsteadiness on feet: Secondary | ICD-10-CM | POA: Insufficient documentation

## 2022-04-11 DIAGNOSIS — R42 Dizziness and giddiness: Secondary | ICD-10-CM | POA: Diagnosis present

## 2022-04-11 DIAGNOSIS — H8111 Benign paroxysmal vertigo, right ear: Secondary | ICD-10-CM | POA: Diagnosis present

## 2022-04-11 NOTE — Therapy (Signed)
OUTPATIENT PHYSICAL THERAPY VESTIBULAR TREATMENT     Patient Name: Shaun [name] MRN: [id number] DOB:06-20-1935, 87 y.o., male Today's Date: 04/11/2022  PCP: [name], MD REFERRING PROVIDER: [name], MD   PT End of Session - 04/11/22 0935     Visit Number 6    Number of Visits 11    Date for PT Re-Evaluation 05/10/22    Authorization Type Medicare/AARP    PT Start Time 0851    PT Stop Time 0931    PT Time Calculation (min) 40 min    Equipment Utilized During Treatment Gait belt    Activity Tolerance Patient tolerated treatment well    Behavior During Therapy [facility] for tasks assessed/performed                  Past Medical History:  Diagnosis Date   Hyperlipidemia    Past Surgical History:  Procedure Laterality Date   APPENDECTOMY     as child   CATARACT EXTRACTION, BILATERAL     HAMMER TOE SURGERY     Patient Active Problem List   Diagnosis Date Noted   Falls frequently 12/27/2021   History of colon polyps 01/15/2015   Aortic valve sclerosis 03/13/2014   Encounter for therapeutic drug level monitoring 03/13/2014   GERD (gastroesophageal reflux disease) 08/28/2011   Hypercholesteremia 08/28/2011    ONSET DATE: 12/18/21  REFERRING DIAG: R42 (ICD-10-CM) - Dizziness and giddiness  THERAPY DIAG:  BPPV (benign paroxysmal positional vertigo), right  Dizziness and giddiness  Unsteadiness on feet  Rationale for Evaluation and Treatment Rehabilitation  SUBJECTIVE:   SUBJECTIVE STATEMENT: Not much. Denies dizziness.  Pt accompanied by: significant other  PERTINENT HISTORY: HLD, hammer toe surgery   PAIN:  Are you having pain? No  PRECAUTIONS: Fall  PATIENT GOALS improve dizziness  OBJECTIVE:     TODAY'S TREATMENT: 04/11/22 Activity Comments  STS on foam 10x Pushing off knees; cues to scoot forward  STS EC 10x Pushing off knees; cues to scoot forward  gait + pivot turns  Initally with small shuffling steps which improved with  cueing   gait + figure 8 turns with and without ball toss Tendency to knock off cones trouble coordinating pattern; cues to maintain stepping continuity; much more difficulty with dual task   gait + head turns and nods 3x 61f each Cueing to increase pace and excursion of head movement; increased unsteadiness with head turns vs nods  fwd/back stepping with and without head nods 10x each, each LE  Cueing to decrease step length when performing with head movements d/t imbalance; CGA-min A required   1/2 tandem on foam 30" each Mod sway      HOME EXERCISE PROGRAM Last updated: 04/11/22 Access Code: [email or web address]: [email or web address] Date: 04/11/2022 Prepared by: [facility] Program Notes perform at counter top or in a corner for safety.  Exercises - Standing Toe Taps  - 1 x daily - 5 x weekly - 2 sets - 10 reps - Romberg Stance with Head Nods  - 1 x daily - 5 x weekly - 2 sets - 30 sec hold - Romberg Stance with Head Rotation  - 1 x daily - 5 x weekly - 2 sets - 30 sec hold - 180 Degree Pivot Turn with Single Point Cane  - 1 x daily - 5 x weekly - 2 sets - 10 reps - Romberg Stance on Foam Pad  - 1 x daily - 5 x weekly -  2-3 sets - 30 sec hold - Standing March with Counter Support  - 1 x daily - 5 x weekly - 2 sets - 30 sec hold - Gait with Head Nods and Turns on Grass  - 1 x daily - 5 x weekly - 3 sets - Figure-8 Walking Around Cones  - 1 x daily - 5 x weekly - 2 sets - 10 reps   PATIENT EDUCATION: Education details: HEP update & edu on safe performance of exercises  Person educated: Patient and Spouse Education method: Explanation, Demonstration, Tactile cues, Verbal cues, and Handouts Education comprehension: verbalized understanding and returned demonstration   Below measures were taken at time of initial evaluation unless otherwise specified:  DIAGNOSTIC FINDINGS: 01/28/22 brain MRI: No acute or reversible finding. Mild  chronic small vessel ischemia. Generalized cortical atrophy.  COGNITION: Overall cognitive status:  slightly forgetful- wife assists in subjective report    SENSATION: WFL  GAIT: Gait pattern:  slightly wider BOS, slow, and unsteadiness with transfers Assistive device utilized: None  PATIENT SURVEYS:  FOTO [id number]   VESTIBULAR ASSESSMENT   GENERAL OBSERVATION: not wearing glasses; occasionally wears readers      OCULOMOTOR EXAM:   Ocular Alignment: normal and L eyelid droop   Ocular ROM: No Limitations   Spontaneous Nystagmus: absent   Gaze-Induced Nystagmus: subtle R beating nystagmus with R gaze   Smooth Pursuits:  1-2 saccades with inferior direction   Saccades: hypometric/undershoots to R and inferior directions    Convergence/Divergence: 3 inches      VESTIBULAR - OCULAR REFLEX:    Slow VOR: Comment: slow and stopping at midline horizontal; slight difficulty maintaining gaze vertical   VOR Cancellation: Normal   Head-Impulse Test: HIT Right: difficult to discern d/t guarding HIT Left: difficult to discern d/t guarding       POSITIONAL TESTING:  Right Roll Test: negative Left Roll Test: negative but reported mild dizziness upon rolling supine from L side Right Dix-Hallpike: R upbeating torsional nystagmus; Duration:5 sec Left Dix-Hallpike: negative   03/29/22 M-CTSIB  Condition 1: Firm Surface, EO 30 Sec, Normal Sway  Condition 2: Firm Surface, EC 30 Sec, Mild Sway  Condition 3: Foam Surface, EO 30 Sec, Mild and Moderate Sway  Condition 4: Foam Surface, EC 10 Sec, Severe Sway     VESTIBULAR TREATMENT:  Canalith Repositioning:   Epley Right: Number of Reps: 1, Response to Treatment: comment: unable to re-test, and Comment: tolerated well   PATIENT EDUCATION: Education details: prognosis, POC, edu on exam findings and expectations after CRM Person educated: Patient and Spouse Education method: Explanation Education comprehension: verbalized  understanding   GOALS: Goals reviewed with patient? Yes  SHORT TERM GOALS: Target date: 03/15/2022  Patient to be independent with initial HEP. Baseline: HEP initiated Goal status: MET 03/29/22    LONG TERM GOALS: Target date: 05/10/2022  Patient to be independent with advanced HEP. Baseline: Not yet initiated  Goal status: IN PROGRESS   Patient to report 0/10 dizziness with standing quick vertical and horizontal VOR for 30 seconds. Baseline: Unable Goal status: MET 03/29/22  Patient will report 0/10 dizziness with bed mobility.  Baseline: Symptomatic  Goal status: MET 03/29/22  Patient to demonstrate mild-moderate sway with M-CTSIB condition with eyes closed/foam surface in order to improve safety in environments with uneven surfaces and dim lighting. Baseline: NT; 10 sec with severe sway 03/29/22 Goal status: IN PROGRESS  Patient to score at least 20/24 on DGI in order to decrease risk of falls. Baseline:  17/24 03/29/22 Goal status: IN PROGRESS    ASSESSMENT:  CLINICAL IMPRESSION: Patient arrived to session without complaints. Worked on dynamic balance activities with addition of head nods and turns. Patient with more imbalance with head turns vs. Nods. Also demonstrating difficulty coordinating and small steps with turning activities which improved with practice and cueing. Overall trouble with dual tasking activities was evident today; will require more practice. Patient reported understanding of all edu provided and without complaints at end of session.     OBJECTIVE IMPAIRMENTS Abnormal gait, decreased activity tolerance, decreased balance, decreased safety awareness, and dizziness.   ACTIVITY LIMITATIONS carrying, lifting, bending, standing, squatting, sleeping, stairs, transfers, bed mobility, bathing, dressing, and reach over head  PARTICIPATION LIMITATIONS: meal prep, laundry, driving, shopping, community activity, yard work, and church  PERSONAL FACTORS Age,  Past/current experiences, Time since onset of injury/illness/exacerbation, and 1-2 comorbidities: HLD, hammer toe surgery  are also affecting patient's functional outcome.   REHAB POTENTIAL: Good  CLINICAL DECISION MAKING: Evolving/moderate complexity  EVALUATION COMPLEXITY: Moderate   PLAN: PT FREQUENCY: 1x/week  PT DURATION: 6 weeks  PLANNED INTERVENTIONS: Therapeutic exercises, Therapeutic activity, Neuromuscular re-education, Balance training, Gait training, Patient/Family education, Self Care, Joint mobilization, Stair training, Vestibular training, Canalith repositioning, Aquatic Therapy, Dry Needling, Electrical stimulation, Cryotherapy, Moist heat, Taping, Manual therapy, and Re-evaluation  PLAN FOR NEXT SESSION: dual task; balance training including turns, stairs, walking with head turns/nods, compliance surface and EC  Janene Harvey, PT, DPT 04/11/22 9:36 AM  [facility] at [facility] [address] [place], [place] [address] Phone # [phone] Fax # [phone]

## 2022-04-18 ENCOUNTER — Encounter: Payer: Self-pay | Admitting: Physical Therapy

## 2022-04-18 ENCOUNTER — Ambulatory Visit: Payer: Medicare Other | Admitting: Physical Therapy

## 2022-04-18 DIAGNOSIS — R2681 Unsteadiness on feet: Secondary | ICD-10-CM

## 2022-04-18 DIAGNOSIS — R42 Dizziness and giddiness: Secondary | ICD-10-CM

## 2022-04-18 DIAGNOSIS — H8111 Benign paroxysmal vertigo, right ear: Secondary | ICD-10-CM | POA: Diagnosis not present

## 2022-04-18 NOTE — Therapy (Signed)
OUTPATIENT PHYSICAL THERAPY VESTIBULAR TREATMENT     Patient Name: Shaun [name] MRN: [id number] DOB:[birth year], 87 y.o., male Today's Date: 04/18/2022  PCP: [name], MD REFERRING PROVIDER: [name], MD   PT End of Session - 04/18/22 0928     Visit Number 7    Number of Visits 11    Date for PT Re-Evaluation 05/10/22    Authorization Type Medicare/[employer]    PT Start Time 0848    PT Stop Time 0928    PT Time Calculation (min) 40 min    Equipment Utilized During Treatment Gait belt    Activity Tolerance Patient tolerated treatment well    Behavior During Therapy [facility] for tasks assessed/performed                   Past Medical History:  Diagnosis Date   Hyperlipidemia    Past Surgical History:  Procedure Laterality Date   APPENDECTOMY     as child   CATARACT EXTRACTION, BILATERAL     HAMMER TOE SURGERY     Patient Active Problem List   Diagnosis Date Noted   Falls frequently 12/27/2021   History of colon polyps 01/15/2015   Aortic valve sclerosis 03/13/2014   Encounter for therapeutic drug level monitoring 03/13/2014   GERD (gastroesophageal reflux disease) 08/28/2011   Hypercholesteremia 08/28/2011    ONSET DATE: 12/18/21  REFERRING DIAG: R42 (ICD-10-CM) - Dizziness and giddiness  THERAPY DIAG:  BPPV (benign paroxysmal positional vertigo), right  Dizziness and giddiness  Unsteadiness on feet  Rationale for Evaluation and Treatment Rehabilitation  SUBJECTIVE:   SUBJECTIVE STATEMENT: Not much new.  Pt accompanied by: significant other  PERTINENT HISTORY: HLD, hammer toe surgery   PAIN:  Are you having pain? No  PRECAUTIONS: Fall  PATIENT GOALS improve dizziness  OBJECTIVE:     TODAY'S TREATMENT: 04/18/22 Activity Comments  walking + hand to hand ball toss 4x70 ft Cuing to maintain continuity of stepps; CGA-min A d/t imbalance   backwards walking + ball toss 2x51f Audibly skidding feet d/t short step height and length;  difficulty coordinating  backwards walking 2x774fCues to control backwards momentum and increase step height/length   fwd/back stepping + head turns to targets  with CGA-min A d/t imbalance; heavy verbal and manual cueing required to coordinate stepping with head turns   4 square step with CGA Cueing for safe placement of feet before stepping and increase step length/height;   step up + opposite SKTC 10x  Holding on with 1 UE; cues for safe foot placement and following activity correctly      HOME EXERCISE PROGRAM Last updated: 04/18/22 Access Code: [email or web address]: [email or web address] Date: 04/18/2022 [facility]ared by: [facility] perform at counter top or in a corner for safety.  Exercises - Standing Toe Taps  - 1 x daily - 5 x weekly - 2 sets - 10 reps - Romberg Stance with Head Nods  - 1 x daily - 5 x weekly - 2 sets - 30 sec hold - Romberg Stance with Head Rotation  - 1 x daily - 5 x weekly - 2 sets - 30 sec hold - 180 Degree Pivot Turn with Single Point Cane  - 1 x daily - 5 x weekly - 2 sets - 10 reps - Romberg Stance on Foam Pad  - 1 x daily - 5 x weekly - 2-3 sets - 30 sec hold - Standing March with Counter Support  -  1 x daily - 5 x weekly - 2 sets - 30 sec hold - Gait with Head Nods and Turns on Grass  - 1 x daily - 5 x weekly - 3 sets - Figure-8 Walking Around Cones  - 1 x daily - 5 x weekly - 2 sets - 10 reps - Backward Walking with Counter Support  - 1 x daily - 5 x weekly - 2 sets - 10 reps    PATIENT EDUCATION: Education details: HEP update Person educated: Patient and Spouse Education method: Explanation, Demonstration, Tactile cues, Verbal cues, and Handouts Education comprehension: verbalized understanding and returned demonstration   Below measures were taken at time of initial evaluation unless otherwise specified:  DIAGNOSTIC FINDINGS: 01/28/22 brain MRI: No acute or reversible finding. Mild  chronic small vessel ischemia. Generalized cortical atrophy.  COGNITION: Overall cognitive status:  slightly forgetful- wife assists in subjective report    SENSATION: WFL  GAIT: Gait pattern:  slightly wider BOS, slow, and unsteadiness with transfers Assistive device utilized: None  PATIENT SURVEYS:  FOTO [phone]   VESTIBULAR ASSESSMENT   GENERAL OBSERVATION: not wearing glasses; occasionally wears readers      OCULOMOTOR EXAM:   Ocular Alignment: normal and L eyelid droop   Ocular ROM: No Limitations   Spontaneous Nystagmus: absent   Gaze-Induced Nystagmus: subtle R beating nystagmus with R gaze   Smooth Pursuits:  1-2 saccades with inferior direction   Saccades: hypometric/undershoots to R and inferior directions    Convergence/Divergence: 3 inches      VESTIBULAR - OCULAR REFLEX:    Slow VOR: Comment: slow and stopping at midline horizontal; slight difficulty maintaining gaze vertical   VOR Cancellation: Normal   Head-Impulse Test: HIT Right: difficult to discern d/t guarding HIT Left: difficult to discern d/t guarding       POSITIONAL TESTING:  Right Roll Test: negative Left Roll Test: negative but reported mild dizziness upon rolling supine from L side Right Dix-Hallpike: R upbeating torsional nystagmus; Duration:5 sec Left Dix-Hallpike: negative   03/29/22 M-CTSIB  Condition 1: Firm Surface, EO 30 Sec, Normal Sway  Condition 2: Firm Surface, EC 30 Sec, Mild Sway  Condition 3: Foam Surface, EO 30 Sec, Mild and Moderate Sway  Condition 4: Foam Surface, EC 10 Sec, Severe Sway     VESTIBULAR TREATMENT:  Canalith Repositioning:   Epley Right: Number of Reps: 1, Response to Treatment: comment: unable to re-test, and Comment: tolerated well   PATIENT EDUCATION: Education details: prognosis, POC, edu on exam findings and expectations after CRM Person educated: Patient and Spouse Education method: Explanation Education comprehension: verbalized  understanding   GOALS: Goals reviewed with patient? Yes  SHORT TERM GOALS: Target date: 03/15/2022  Patient to be independent with initial HEP. Baseline: HEP initiated Goal status: MET 03/29/22    LONG TERM GOALS: Target date: 05/10/2022  Patient to be independent with advanced HEP. Baseline: Not yet initiated  Goal status: IN PROGRESS   Patient to report 0/10 dizziness with standing quick vertical and horizontal VOR for 30 seconds. Baseline: Unable Goal status: MET 03/29/22  Patient will report 0/10 dizziness with bed mobility.  Baseline: Symptomatic  Goal status: MET 03/29/22  Patient to demonstrate mild-moderate sway with M-CTSIB condition with eyes closed/foam surface in order to improve safety in environments with uneven surfaces and dim lighting. Baseline: NT; 10 sec with severe sway 03/29/22 Goal status: IN PROGRESS  Patient to score at least 20/24 on DGI in order to decrease risk of falls. Baseline:  17/24 03/29/22 Goal status: IN PROGRESS    ASSESSMENT:  CLINICAL IMPRESSION: Patient arrived to session without new complaints. Worked on dual tasking activities with reminders required to maintain stepping continuity, length, and height. Patient with difficulty coordinating UE and LE movements together; benefitted from breaking movements down individually before continuing together. Stepping with head turns revealed difficulty with coordination and maintaining balance; coordination did improve with practice. Patient reported understanding of all edu provided and without complaints at end of session.      OBJECTIVE IMPAIRMENTS Abnormal gait, decreased activity tolerance, decreased balance, decreased safety awareness, and dizziness.   ACTIVITY LIMITATIONS carrying, lifting, bending, standing, squatting, sleeping, stairs, transfers, bed mobility, bathing, dressing, and reach over head  PARTICIPATION LIMITATIONS: meal prep, laundry, driving, shopping, community  activity, yard work, and church  PERSONAL FACTORS Age, Past/current experiences, Time since onset of injury/illness/exacerbation, and 1-2 comorbidities: HLD, hammer toe surgery  are also affecting patient's functional outcome.   REHAB POTENTIAL: Good  CLINICAL DECISION MAKING: Evolving/moderate complexity  EVALUATION COMPLEXITY: Moderate   PLAN: PT FREQUENCY: 1x/week  PT DURATION: 6 weeks  PLANNED INTERVENTIONS: Therapeutic exercises, Therapeutic activity, Neuromuscular re-education, Balance training, Gait training, Patient/Family education, Self Care, Joint mobilization, Stair training, Vestibular training, Canalith repositioning, Aquatic Therapy, Dry Needling, Electrical stimulation, Cryotherapy, Moist heat, Taping, Manual therapy, and Re-evaluation  PLAN FOR NEXT SESSION: dual task; balance training including turns, stairs, walking with head turns/nods, compliance surface and EC  Janene Harvey, PT, DPT 04/18/22 9:29 AM  [facility] [address], [place], [place] [address] Phone # [phone] Fax # [phone]

## 2022-04-24 NOTE — Therapy (Signed)
OUTPATIENT PHYSICAL THERAPY VESTIBULAR TREATMENT     Patient Name: Shaun [name] MRN: [id number] DOB:[birth year], 87 y.o., male Today's Date: 04/24/2022  PCP: [name], MD REFERRING PROVIDER: [name], MD           Past Medical History:  Diagnosis Date   Hyperlipidemia    Past Surgical History:  Procedure Laterality Date   APPENDECTOMY     as child   CATARACT EXTRACTION, BILATERAL     HAMMER TOE SURGERY     Patient Active Problem List   Diagnosis Date Noted   Falls frequently 12/27/2021   History of colon polyps 01/15/2015   Aortic valve sclerosis 03/13/2014   Encounter for therapeutic drug level monitoring 03/13/2014   GERD (gastroesophageal reflux disease) 08/28/2011   Hypercholesteremia 08/28/2011    ONSET DATE: 12/18/21  REFERRING DIAG: R42 (ICD-10-CM) - Dizziness and giddiness  THERAPY DIAG:  No diagnosis found.  Rationale for Evaluation and Treatment Rehabilitation  SUBJECTIVE:   SUBJECTIVE STATEMENT: Not much new.  Pt accompanied by: significant other  PERTINENT HISTORY: HLD, hammer toe surgery   PAIN:  Are you having pain? No  PRECAUTIONS: Fall  PATIENT GOALS improve dizziness  OBJECTIVE:     TODAY'S TREATMENT: 04/25/22 Activity Comments                      HOME EXERCISE PROGRAM Last updated: 04/18/22 Access Code: [email or web address] URL: [email or web address] Date: 04/18/2022 Prepared by: [facility]  Program Notes perform at counter top or in a corner for safety.  Exercises - Standing Toe Taps  - 1 x daily - 5 x weekly - 2 sets - 10 reps - Romberg Stance with Head Nods  - 1 x daily - 5 x weekly - 2 sets - 30 sec hold - Romberg Stance with Head Rotation  - 1 x daily - 5 x weekly - 2 sets - 30 sec hold - 180 Degree Pivot Turn with Single Point Cane  - 1 x daily - 5 x weekly - 2 sets - 10 reps - Romberg Stance on Foam Pad  - 1 x daily - 5 x weekly - 2-3 sets - 30 sec  hold - Standing March with Counter Support  - 1 x daily - 5 x weekly - 2 sets - 30 sec hold - Gait with Head Nods and Turns on Grass  - 1 x daily - 5 x weekly - 3 sets - Figure-8 Walking Around Cones  - 1 x daily - 5 x weekly - 2 sets - 10 reps - Backward Walking with Counter Support  - 1 x daily - 5 x weekly - 2 sets - 10 reps    Below measures were taken at time of initial evaluation unless otherwise specified:  DIAGNOSTIC FINDINGS: 01/28/22 brain MRI: No acute or reversible finding. Mild chronic small vessel ischemia. Generalized cortical atrophy.  COGNITION: Overall cognitive status:  slightly forgetful- wife assists in subjective report    SENSATION: WFL  GAIT: Gait pattern:  slightly wider BOS, slow, and unsteadiness with transfers Assistive device utilized: None  PATIENT SURVEYS:  FOTO [phone]   VESTIBULAR ASSESSMENT   GENERAL OBSERVATION: not wearing glasses; occasionally wears readers      OCULOMOTOR EXAM:   Ocular Alignment: normal and L eyelid droop   Ocular ROM: No Limitations   Spontaneous Nystagmus: absent   Gaze-Induced Nystagmus: subtle R beating nystagmus with R gaze   Smooth Pursuits:  1-2 saccades  with inferior direction   Saccades: hypometric/undershoots to R and inferior directions    Convergence/Divergence: 3 inches      VESTIBULAR - OCULAR REFLEX:    Slow VOR: Comment: slow and stopping at midline horizontal; slight difficulty maintaining gaze vertical   VOR Cancellation: Normal   Head-Impulse Test: HIT Right: difficult to discern d/t guarding HIT Left: difficult to discern d/t guarding       POSITIONAL TESTING:  Right Roll Test: negative Left Roll Test: negative but reported mild dizziness upon rolling supine from L side Right Dix-Hallpike: R upbeating torsional nystagmus; Duration:5 sec Left Dix-Hallpike: negative   03/29/22 M-CTSIB  Condition 1: Firm Surface, EO 30 Sec, Normal Sway  Condition 2: Firm Surface, EC 30 Sec, Mild Sway   Condition 3: Foam Surface, EO 30 Sec, Mild and Moderate Sway  Condition 4: Foam Surface, EC 10 Sec, Severe Sway     VESTIBULAR TREATMENT:  Canalith Repositioning:   Epley Right: Number of Reps: 1, Response to Treatment: comment: unable to re-test, and Comment: tolerated well   PATIENT EDUCATION: Education details: prognosis, POC, edu on exam findings and expectations after CRM Person educated: Patient and Spouse Education method: Explanation Education comprehension: verbalized understanding   GOALS: Goals reviewed with patient? Yes  SHORT TERM GOALS: Target date: 03/15/2022  Patient to be independent with initial HEP. Baseline: HEP initiated Goal status: MET 03/29/22    LONG TERM GOALS: Target date: 05/10/2022  Patient to be independent with advanced HEP. Baseline: Not yet initiated  Goal status: IN PROGRESS   Patient to report 0/10 dizziness with standing quick vertical and horizontal VOR for 30 seconds. Baseline: Unable Goal status: MET 03/29/22  Patient will report 0/10 dizziness with bed mobility.  Baseline: Symptomatic  Goal status: MET 03/29/22  Patient to demonstrate mild-moderate sway with M-CTSIB condition with eyes closed/foam surface in order to improve safety in environments with uneven surfaces and dim lighting. Baseline: NT; 10 sec with severe sway 03/29/22 Goal status: IN PROGRESS  Patient to score at least 20/24 on DGI in order to decrease risk of falls. Baseline: 17/24 03/29/22 Goal status: IN PROGRESS    ASSESSMENT:  CLINICAL IMPRESSION: Patient arrived to session without new complaints. Worked on dual tasking activities with reminders required to maintain stepping continuity, length, and height. Patient with difficulty coordinating UE and LE movements together; benefitted from breaking movements down individually before continuing together. Stepping with head turns revealed difficulty with coordination and maintaining balance; coordination did  improve with practice. Patient reported understanding of all edu provided and without complaints at end of session.      OBJECTIVE IMPAIRMENTS Abnormal gait, decreased activity tolerance, decreased balance, decreased safety awareness, and dizziness.   ACTIVITY LIMITATIONS carrying, lifting, bending, standing, squatting, sleeping, stairs, transfers, bed mobility, bathing, dressing, and reach over head  PARTICIPATION LIMITATIONS: meal prep, laundry, driving, shopping, community activity, yard work, and church  PERSONAL FACTORS Age, Past/current experiences, Time since onset of injury/illness/exacerbation, and 1-2 comorbidities: HLD, hammer toe surgery  are also affecting patient's functional outcome.   REHAB POTENTIAL: Good  CLINICAL DECISION MAKING: Evolving/moderate complexity  EVALUATION COMPLEXITY: Moderate   PLAN: PT FREQUENCY: 1x/week  PT DURATION: 6 weeks  PLANNED INTERVENTIONS: Therapeutic exercises, Therapeutic activity, Neuromuscular re-education, Balance training, Gait training, Patient/Family education, Self Care, Joint mobilization, Stair training, Vestibular training, Canalith repositioning, Aquatic Therapy, Dry Needling, Electrical stimulation, Cryotherapy, Moist heat, Taping, Manual therapy, and Re-evaluation  PLAN FOR NEXT SESSION: dual task; balance training including turns, stairs, walking with  head turns/nods, compliance surface and EC  Janene Harvey, PT, DPT 04/24/22 5:35 PM  [facility], [place], [place] [address] Phone # [phone] Fax # [phone]

## 2022-04-25 ENCOUNTER — Encounter: Payer: Self-pay | Admitting: Physical Therapy

## 2022-04-25 ENCOUNTER — Ambulatory Visit: Payer: Medicare Other | Admitting: Physical Therapy

## 2022-04-25 DIAGNOSIS — H8111 Benign paroxysmal vertigo, right ear: Secondary | ICD-10-CM | POA: Diagnosis not present

## 2022-04-25 DIAGNOSIS — R42 Dizziness and giddiness: Secondary | ICD-10-CM

## 2022-04-25 DIAGNOSIS — R2681 Unsteadiness on feet: Secondary | ICD-10-CM

## 2022-05-18 ENCOUNTER — Ambulatory Visit (INDEPENDENT_AMBULATORY_CARE_PROVIDER_SITE_OTHER): Payer: Medicare Other | Admitting: Psychiatry

## 2022-05-18 ENCOUNTER — Telehealth: Payer: Self-pay | Admitting: Psychiatry

## 2022-05-18 ENCOUNTER — Encounter: Payer: Self-pay | Admitting: Psychiatry

## 2022-05-18 VITALS — BP 141/78 | HR 55 | Ht 70.0 in | Wt 189.0 lb

## 2022-05-18 DIAGNOSIS — R413 Other amnesia: Secondary | ICD-10-CM | POA: Diagnosis not present

## 2022-05-18 NOTE — Telephone Encounter (Signed)
Referral for neuropsychology fax to [facility]. Phone: [phone], Fax: [phone]. 

## 2022-05-18 NOTE — Progress Notes (Signed)
   CC: memory loss  Follow-up Visit  Last visit:  05/18/21  Brief HPI: 87 year old male with a history of GERD, HLD who follows in clinic for memory loss.   Interval History: Memory is slightly worse since his last visit. He will forget to turn the stove off when cooking. He will intermittently forget to take medications. He is still driving, did get lost one time 1-2 months ago when he was driving a long distance in an unfamiliar place. Was able to find his way back. Now is limiting to short distances and familiar places.   Tripped and fell in June 2023 and hit his head. Brain MRI 01/28/22 showed generalized atrophy and mild chronic small vessel ischemia. He has been going to PT for vertigo.   He was started on donepezil, but this caused dizziness so it was just switched to Namenda 2 weeks ago. He has been taking 5 mg daily.  Physical Exam:   Vital Signs: BP (!) 141/78   Pulse (!) 55   Ht 5\' 10"  (1.778 m)   Wt 189 lb (85.7 kg)   BMI 27.12 kg/m  GENERAL:  well appearing, in no acute distress, alert  SKIN:  Color, texture, turgor normal. No rashes or lesions HEAD:  Normocephalic/atraumatic. RESP: normal respiratory effort MSK:  No gross joint deformities.   NEUROLOGICAL: Mental Status:    05/18/2022   11:05 AM  Montreal Cognitive Assessment   Visuospatial/ Executive (0/5) 1  Naming (0/3) 1  Attention: Read list of digits (0/2) 2  Attention: Read list of letters (0/1) 1  Attention: Serial 7 subtraction starting at 100 (0/3) 1  Language: Repeat phrase (0/2) 2  Language : Fluency (0/1) 0  Abstraction (0/2) 0  Delayed Recall (0/5) 0  Orientation (0/6) 2  Total 10   Cranial Nerves: PERRL, face symmetric, no dysarthria, hearing grossly intact Motor: moves all extremities equally Gait: normal-based.  IMPRESSION: 87 year old male with a history of GERD, HLD who presents for follow up of memory loss. MRI brain 01/28/22 showed generalized atrophy and mild chronic small  vessel ischemia. While he remains relatively independent with his ADLS, MOCA today is 42/35, which is concerning for early dementia. Will refer for neuropsychological testing for better characterization of his memory deficits. He started Namenda 5 mg daily 2 weeks ago. Advised that he can increase this to 5 mg BID.  PLAN: -Increase Namenda to 5 mg BID -Referral for neuropsychological testing  Follow-up: 6 months  I spent a total of 24 minutes on the date of the service. Discussed medication side effects, adverse reactions and drug interactions. Written educational materials and patient instructions outlining all of the above were given.  Genia Harold, MD 05/18/22 12:02 PM

## 2022-07-17 ENCOUNTER — Ambulatory Visit: Payer: Medicare Other | Admitting: Podiatry

## 2022-08-03 ENCOUNTER — Ambulatory Visit (INDEPENDENT_AMBULATORY_CARE_PROVIDER_SITE_OTHER): Payer: Medicare Other | Admitting: Podiatry

## 2022-08-03 DIAGNOSIS — M79674 Pain in right toe(s): Secondary | ICD-10-CM

## 2022-08-03 DIAGNOSIS — B351 Tinea unguium: Secondary | ICD-10-CM

## 2022-08-03 DIAGNOSIS — M79675 Pain in left toe(s): Secondary | ICD-10-CM

## 2022-08-06 NOTE — Progress Notes (Signed)
  Subjective:  Patient ID: Shaun [name], male    DOB: [birth year],  MRN: [id number]  Chief Complaint  Patient presents with   Nail Problem    Thick painful toenails, 3 month follow up    87 y.o. male presents with the above complaint. History confirmed with patient.   Objective:  Physical Exam: warm, good capillary refill, no trophic changes or ulcerative lesions, normal DP and PT pulses, and normal sensory exam. Left Foot: dystrophic yellowed discolored nail plates with subungual debris Right Foot: dystrophic yellowed discolored nail plates with subungual debris   Assessment:  No diagnosis found.   Plan:  Patient was evaluated and treated and all questions answered.  Discussed the etiology and treatment options for the condition in detail with the patient. Educated patient on the topical and oral treatment options for mycotic nails. Recommended debridement of the nails today. Sharp and mechanical debridement performed of all painful and mycotic nails today. Nails debrided in length and thickness using a nail nipper to level of comfort. Discussed treatment options including appropriate shoe gear. Follow up as needed for painful nails.    Return in about 3 months (around 11/02/2022) for painful thick nails.

## 2022-10-04 ENCOUNTER — Telehealth: Payer: Self-pay | Admitting: *Deleted

## 2022-10-04 NOTE — Telephone Encounter (Signed)
error 

## 2022-11-14 ENCOUNTER — Other Ambulatory Visit (HOSPITAL_COMMUNITY): Payer: Self-pay | Admitting: Internal Medicine

## 2022-11-14 DIAGNOSIS — I358 Other nonrheumatic aortic valve disorders: Secondary | ICD-10-CM

## 2022-11-21 ENCOUNTER — Ambulatory Visit (INDEPENDENT_AMBULATORY_CARE_PROVIDER_SITE_OTHER): Payer: Medicare Other | Admitting: Podiatry

## 2022-11-21 DIAGNOSIS — M79675 Pain in left toe(s): Secondary | ICD-10-CM

## 2022-11-21 DIAGNOSIS — L84 Corns and callosities: Secondary | ICD-10-CM | POA: Diagnosis not present

## 2022-11-21 DIAGNOSIS — M79672 Pain in left foot: Secondary | ICD-10-CM | POA: Diagnosis not present

## 2022-11-21 DIAGNOSIS — M79671 Pain in right foot: Secondary | ICD-10-CM

## 2022-11-21 DIAGNOSIS — M79674 Pain in right toe(s): Secondary | ICD-10-CM

## 2022-11-21 DIAGNOSIS — B351 Tinea unguium: Secondary | ICD-10-CM | POA: Diagnosis not present

## 2022-11-21 NOTE — Progress Notes (Signed)
  Subjective:  Patient ID: Shaun [name], male    DOB: 1934/12/23,  MRN: [id number]  Teng Decou presents to clinic today for callus(es) b/l feet and painful thick toenails that are difficult to trim. Painful toenails interfere with ambulation. Aggravating factors include wearing enclosed shoe gear. Pain is relieved with periodic professional debridement. Painful calluses are aggravated when weightbearing with and without shoegear. Pain is relieved with periodic professional debridement.  Chief Complaint  Patient presents with   Nail Problem    [facility] PCP-[facility] PCP VST-1 year ago   New problem(s): None.   PCP is Creola Corn, MD.  No Known Allergies  Review of Systems: Negative except as noted in the HPI.  Objective: No changes noted in today's physical examination. There were no vitals filed for this visit. Shaun [name] is a pleasant 88 y.o. male WD, WN in NAD. AAO x 3.  Constitutional Shaun [name] is a pleasant 88 y.o. Caucasian male, WD, WN in NAD. AAO x 3.   Vascular Capillary refill time to digits immediate b/l. Palpable pedal pulses b/l LE. Pedal hair sparse. No pain with calf compression b/l. Lower extremity skin temperature gradient within normal limits. No edema noted b/l LE. No cyanosis or clubbing noted b/l LE.  Neurologic Normal speech. Oriented to person, place, and time. Protective sensation intact 5/5 intact bilaterally with 10g monofilament b/l. Vibratory sensation intact b/l. Proprioception intact bilaterally.  Dermatologic Pedal integument with normal turgor, texture and tone b/l LE. No open wounds b/l. No interdigital macerations b/l.   Toenails 1-5 b/l elongated, thickened, discolored with subungual debris. +Tenderness with dorsal palpation of nailplates.   Hyperkeratotic lesion(s) noted submet head 5 b/l.  Orthopedic: Muscle strength 5/5 to all lower extremity muscle groups bilaterally. Hallux hammertoe noted b/l LE. Hammertoe deformity noted 2-5 b/l.    Radiographs: None  Assessment/Plan: 1. Pain due to onychomycosis of toenails of both feet   2. Callus   3. Pain in both feet     -Consent given for treatment as described below: -Examined patient. -Patient to continue soft, supportive shoe gear daily. -Callus(es) submet head 5 b/l pared utilizing sterile scalpel blade without complication or incident. Total number debrided =2. -Patient/POA to call should there be question/concern in the interim.   Return in about 3 months (around 02/20/2023).  Freddie Breech, DPM

## 2022-11-25 ENCOUNTER — Encounter: Payer: Self-pay | Admitting: Podiatry

## 2022-11-25 DIAGNOSIS — R413 Other amnesia: Secondary | ICD-10-CM | POA: Insufficient documentation

## 2022-11-25 DIAGNOSIS — I6523 Occlusion and stenosis of bilateral carotid arteries: Secondary | ICD-10-CM | POA: Insufficient documentation

## 2022-11-25 DIAGNOSIS — R600 Localized edema: Secondary | ICD-10-CM | POA: Insufficient documentation

## 2022-12-05 ENCOUNTER — Ambulatory Visit (INDEPENDENT_AMBULATORY_CARE_PROVIDER_SITE_OTHER): Payer: Medicare Other | Admitting: Neurology

## 2022-12-05 ENCOUNTER — Encounter: Payer: Self-pay | Admitting: Neurology

## 2022-12-05 VITALS — BP 122/75 | HR 56 | Ht 71.0 in | Wt 200.0 lb

## 2022-12-05 DIAGNOSIS — R413 Other amnesia: Secondary | ICD-10-CM

## 2022-12-05 MED ORDER — MEMANTINE HCL 10 MG PO TABS: 10.0000 mg | ORAL_TABLET | Freq: Two times a day (BID) | ORAL | 5 refills | Status: DC

## 2022-12-05 NOTE — Patient Instructions (Addendum)
Increase Namenda to 5 mg in the AM, 10 mg in the evening x 2 weeks, then increase to 10 mg twice daily, stay at this, watch for dizziness  Referral for neuropsych evaluation   Refrain from driving  Recommend exercise, healthy eating, brain stimulating activities

## 2022-12-05 NOTE — Progress Notes (Signed)
Patient: Shaun [name] Date of Birth: [birth year]  Reason for Visit: Follow up History from: Patient, wife Primary Neurologist: Shaun [name]  ASSESSMENT AND PLAN 88 y.o. year old male   1.  Memory loss, likely dementia -Increase Namenda to 10 mg twice daily -Has not been able to tolerate Aricept due to dizziness -I will place the referral for neuropsychological evaluation again for better characterization of memory deficits, Dr. Delena [name] placed in October 2023 -I would recommend against driving given memory impairment MoCA 11/30 today -Recommend consistent physical exercise, brain stimulating activities, healthy eating -Follow-up in 6 months or sooner if needed  Orders Placed This Encounter  Procedures   Ambulatory referral to Neuropsychology   Meds ordered this encounter  Medications   memantine (NAMENDA) 10 MG tablet    Sig: Take 1 tablet (10 mg total) by mouth 2 (two) times daily.    Dispense:  60 tablet    Refill:  5   HISTORY OF PRESENT ILLNESS: Today 12/05/22 Shaun [name] 11/30 today. Never heard from neuropsych testing. Memory slightly worse, short term memory is getting worse. He gets lost in driving, is still driving short distances. Wife says he is not driving much anymore. He doesn't drive anywhere alone. Remains on Namenda 5 mg twice daily, his wife medications in the last 6 months. He mostly stays home, watches TV, reads the paper, check his emails. Wife pays the bills. Sleeps good, has a good appetite. He used to read a lot, read 2 novels weekly, has stopped this.   HISTORY  05/18/22 Dr. Delena [name] Brief HPI: 88 year old male with a history of GERD, HLD who follows in clinic for memory loss.    Interval History: Memory is slightly worse since his last visit. He will forget to turn the stove off when cooking. He will intermittently forget to take medications. He is still driving, did get lost one time 1-2 months ago when he was driving a long distance in an unfamiliar place. Was able  to find his way back. Now is limiting to short distances and familiar places.    Tripped and fell in June 2023 and hit his head. Brain MRI 01/28/22 showed generalized atrophy and mild chronic small vessel ischemia. He has been going to PT for vertigo.    He was started on donepezil, but this caused dizziness so it was just switched to Namenda 2 weeks ago. He has been taking 5 mg daily.  REVIEW OF SYSTEMS: Out of a complete 14 system review of symptoms, the patient complains only of the following symptoms, and all other reviewed systems are negative.  See HPI  ALLERGIES: No Known Allergies  HOME MEDICATIONS: Outpatient Medications Prior to Visit  Medication Sig Dispense Refill   atorvastatin (LIPITOR) 10 MG tablet Take by mouth.     esomeprazole (NEXIUM) 20 MG capsule Take by mouth.     Famotidine (PEPCID PO) Take by mouth.     memantine (NAMENDA) 5 MG tablet Take 5 mg by mouth 2 (two) times daily.     Multiple Vitamins-Minerals (MULTIVITAMIN WITH MINERALS) tablet Take 1 tablet by mouth daily.     Spironolactone (ALDACTONE PO) Take by mouth as needed. Every other day as needed, wife unsure of dose.     No facility-administered medications prior to visit.    PAST MEDICAL HISTORY: Past Medical History:  Diagnosis Date   Hyperlipidemia    Memory loss     PAST SURGICAL HISTORY: Past Surgical History:  Procedure Laterality Date   APPENDECTOMY  as child   CATARACT EXTRACTION, BILATERAL     HAMMER TOE SURGERY      FAMILY HISTORY: Family History  Problem Relation Age of Onset   Alcoholism Father    Cancer Sister    Stroke Maternal Grandfather    Alcoholism Paternal Grandfather     SOCIAL HISTORY: Social History   Socioeconomic History   Marital status: Married    Spouse name: Shaun [name]   Number of children: 3   Years of education: Not on file   Highest education level: Professional school degree (e.g., MD, DDS, DVM, JD)  Occupational History    Comment: retired  oral, Associate Professor  Tobacco Use   Smoking status: Former    Packs/day: 0.50    Years: 20.00    Additional pack years: 0.00    Total pack years: 10.00    Types: Cigarettes   Smokeless tobacco: Never  Substance and Sexual Activity   Alcohol use: Yes    Alcohol/week: 7.0 standard drinks of alcohol    Types: 7 Glasses of wine per week   Drug use: Never   Sexual activity: Not Currently  Other Topics Concern   Not on file  Social History Narrative   05/18/22 lives with wife   Left handed    Some caffine    Social Determinants of Health   Financial Resource Strain: Not on file  Food Insecurity: Not on file  Transportation Needs: Not on file  Physical Activity: Not on file  Stress: Not on file  Social Connections: Not on file  Intimate Partner Violence: Not on file    PHYSICAL EXAM  Vitals:   12/05/22 1347  BP: 122/75  Pulse: (!) 56  Weight: 200 lb (90.7 kg)  Height: 5\' 11"  (1.803 m)   Body mass index is 27.89 kg/m.    12/05/2022    1:50 PM 05/18/2022   11:05 AM  Montreal Cognitive Assessment   Visuospatial/ Executive (0/5) 0 1  Naming (0/3) 2 1  Attention: Read list of digits (0/2) 2 2  Attention: Read list of letters (0/1) 1 1  Attention: Serial 7 subtraction starting at 100 (0/3) 0 1  Language: Repeat phrase (0/2) 2 2  Language : Fluency (0/1) 1 0  Abstraction (0/2) 1 0  Delayed Recall (0/5) 0 0  Orientation (0/6) 2 2  Total 11 10    Generalized: Well developed, in no acute distress  Neurological examination  Mentation: Alert oriented, most history is provided by his wife, he is cooperative, engaged in visit Cranial nerve II-XII: Pupils were equal round reactive to light. Extraocular movements were full, visual field were full on confrontational test. Facial sensation and strength were normal. Head turning and shoulder shrug  were normal and symmetric. Motor: The motor testing reveals 5 over 5 strength of all 4 extremities. Good symmetric motor tone  is noted throughout.  Sensory: Sensory testing is intact to soft touch on all 4 extremities. No evidence of extinction is noted.  Coordination: Cerebellar testing reveals good finger-nose-finger and heel-to-shin bilaterally.  Gait and station: Gait is slightly wide-based Reflexes: Deep tendon reflexes are symmetric and normal bilaterally.   DIAGNOSTIC DATA (LABS, IMAGING, TESTING) - I reviewed patient records, labs, notes, testing and imaging myself where available.  No results found for: "WBC", "HGB", "HCT", "MCV", "PLT" No results found for: "NA", "K", "CL", "CO2", "GLUCOSE", "BUN", "CREATININE", "CALCIUM", "PROT", "ALBUMIN", "AST", "ALT", "ALKPHOS", "BILITOT", "GFRNONAA", "GFRAA" No results found for: "CHOL", "HDL", "LDLCALC", "LDLDIRECT", "TRIG", "CHOLHDL" No results  found for: "HGBA1C" Lab Results  Component Value Date   VITAMINB12 339 05/18/2021   No results found for: "TSH"  Margie Ege, AGNP-C, DNP 12/05/2022, 2:06 PM [facility] [address], [address] [place], [place] [address] [phone]

## 2022-12-06 ENCOUNTER — Ambulatory Visit (HOSPITAL_COMMUNITY)
Admission: RE | Admit: 2022-12-06 | Discharge: 2022-12-06 | Disposition: A | Discharge status: Home / Self Care | Payer: Medicare Other | Source: Ambulatory Visit | Attending: Internal Medicine | Admitting: Internal Medicine

## 2022-12-06 ENCOUNTER — Telehealth: Payer: Self-pay | Admitting: Neurology

## 2022-12-06 DIAGNOSIS — I350 Nonrheumatic aortic (valve) stenosis: Secondary | ICD-10-CM | POA: Diagnosis not present

## 2022-12-06 DIAGNOSIS — I351 Nonrheumatic aortic (valve) insufficiency: Secondary | ICD-10-CM | POA: Diagnosis not present

## 2022-12-06 DIAGNOSIS — I083 Combined rheumatic disorders of mitral, aortic and tricuspid valves: Secondary | ICD-10-CM | POA: Insufficient documentation

## 2022-12-06 DIAGNOSIS — I358 Other nonrheumatic aortic valve disorders: Secondary | ICD-10-CM | POA: Diagnosis present

## 2022-12-06 DIAGNOSIS — K219 Gastro-esophageal reflux disease without esophagitis: Secondary | ICD-10-CM | POA: Insufficient documentation

## 2022-12-06 DIAGNOSIS — E785 Hyperlipidemia, unspecified: Secondary | ICD-10-CM | POA: Diagnosis not present

## 2022-12-06 LAB — ECHOCARDIOGRAM COMPLETE
AR max vel: 1.28 cm2
AV Area VTI: 1.24 cm2
AV Area mean vel: 1.1 cm2
AV Mean grad: 25 mmHg
AV Peak grad: 39.7 mmHg
AV Vena cont: 0.5 cm
Ao pk vel: 3.15 m/s
Area-P 1/2: 3.34 cm2
Calc EF: 53.4 %
P 1/2 time: 692 msec
S' Lateral: 3.5 cm
Single Plane A2C EF: 54.6 %
Single Plane A4C EF: 52.3 %

## 2022-12-06 NOTE — Progress Notes (Signed)
Echocardiogram 2D Echocardiogram has been performed.  Shaun [name] 12/06/2022, 2:57 PM

## 2022-12-06 NOTE — Telephone Encounter (Signed)
Neuropsychology referral faxed to [facility] (fax# [phone], phone# [phone]) 

## 2022-12-07 ENCOUNTER — Ambulatory Visit (HOSPITAL_COMMUNITY): Payer: Medicare Other

## 2022-12-14 NOTE — Telephone Encounter (Signed)
[facility] said they have made multiple attempts to contact the patient to schedule and the patient has not returned their calls.

## 2023-04-18 ENCOUNTER — Ambulatory Visit: Payer: Medicare Other | Admitting: Podiatry

## 2023-06-25 NOTE — Progress Notes (Addendum)
Patient: Shaun [name] Date of Birth: 11-12-1934  Reason for Visit: Follow up History from: Patient, wife Primary Neurologist: Chima/Athar  ASSESSMENT AND PLAN 88 y.o. year old male   1.  Memory loss, likely dementia -Continue Namenda 10 mg twice a day -Has not been able to tolerate Aricept due to dizziness -Check ATN profile, screen for Alzheimer's -Referral for neuropsychological testing has been placed twice -Recommend against driving -Recommend consistent physical exercise, brain stimulating activities, healthy eating -Discussed monitoring mood in the afternoon, may consider low-dose antidepressant if sundowning worsens -Follow-up in 6 months or sooner if needed  Addendum 07/03/23 SS: ATN profile A+T-N-.  I called his wife.  Results may be consistent presence of Alzheimer's related pathology.  We will continue to monitor and continue with plan as above.  HISTORY OF PRESENT ILLNESS: Today 06/26/23 I increased his Namenda 10 mg BID last visit, no side effects. In afternoons, will say needs to go home when he is already home. Is not driving. Is sedentary, watches TV/sleeps, reads newspaper. Claims he attends work out sessions, wife says no. Sleeps at night, after he falls asleep. Does his own ADLs. Denies any issues at home. Has lost interest in his computer. Doesn't walk daily due to balance issues. Has a trainer twice a week to work on balance. Health has been good.   12/05/22 SS: MOCA 11/30 today. Never heard from neuropsych testing. Memory slightly worse, short term memory is getting worse. He gets lost in driving, is still driving short distances. Wife says he is not driving much anymore. He doesn't drive anywhere alone. Remains on Namenda 5 mg twice daily, his wife medications in the last 6 months. He mostly stays home, watches TV, reads the paper, check his emails. Wife pays the bills. Sleeps good, has a good appetite. He used to read a lot, read 2 novels weekly, has stopped  this.   HISTORY  05/18/22 Dr. Delena Bali Brief HPI: 88 year old male with a history of GERD, HLD who follows in clinic for memory loss.    Interval History: Memory is slightly worse since his last visit. He will forget to turn the stove off when cooking. He will intermittently forget to take medications. He is still driving, did get lost one time 1-2 months ago when he was driving a long distance in an unfamiliar place. Was able to find his way back. Now is limiting to short distances and familiar places.    Tripped and fell in June 2023 and hit his head. Brain MRI 01/28/22 showed generalized atrophy and mild chronic small vessel ischemia. He has been going to PT for vertigo.    He was started on donepezil, but this caused dizziness so it was just switched to Namenda 2 weeks ago. He has been taking 5 mg daily.  REVIEW OF SYSTEMS: Out of a complete 14 system review of symptoms, the patient complains only of the following symptoms, and all other reviewed systems are negative.  See HPI  ALLERGIES: No Known Allergies  HOME MEDICATIONS: Outpatient Medications Prior to Visit  Medication Sig Dispense Refill   atorvastatin (LIPITOR) 10 MG tablet Take by mouth.     cyanocobalamin (VITAMIN B12) 1000 MCG tablet Take 1,000 mcg by mouth daily.     esomeprazole (NEXIUM) 20 MG capsule Take by mouth.     Famotidine (PEPCID PO) Take by mouth.     memantine (NAMENDA) 10 MG tablet Take 1 tablet (10 mg total) by mouth 2 (two) times daily. 60  tablet 5   Multiple Vitamins-Minerals (MULTIVITAMIN WITH MINERALS) tablet Take 1 tablet by mouth daily.     Spironolactone (ALDACTONE PO) Take by mouth as needed. Every other day as needed, wife unsure of dose.     No facility-administered medications prior to visit.    PAST MEDICAL HISTORY: Past Medical History:  Diagnosis Date   Hyperlipidemia    Memory loss     PAST SURGICAL HISTORY: Past Surgical History:  Procedure Laterality Date   APPENDECTOMY     as  child   CATARACT EXTRACTION, BILATERAL     HAMMER TOE SURGERY      FAMILY HISTORY: Family History  Problem Relation Age of Onset   Alcoholism Father    Cancer Sister    Stroke Maternal Grandfather    Alcoholism Paternal Grandfather     SOCIAL HISTORY: Social History   Socioeconomic History   Marital status: Married    Spouse name: Suzette Battiest   Number of children: 3   Years of education: Not on file   Highest education level: Professional school degree (e.g., MD, DDS, DVM, JD)  Occupational History    Comment: retired oral, Associate Professor  Tobacco Use   Smoking status: Former    Current packs/day: 0.50    Average packs/day: 0.5 packs/day for 20.0 years (10.0 ttl pk-yrs)    Types: Cigarettes   Smokeless tobacco: Never  Substance and Sexual Activity   Alcohol use: Yes    Alcohol/week: 7.0 standard drinks of alcohol    Types: 7 Glasses of wine per week   Drug use: Never   Sexual activity: Not Currently  Other Topics Concern   Not on file  Social History Narrative   05/18/22 lives with wife   Left handed    Some caffine    Social Determinants of Health   Financial Resource Strain: Not on file  Food Insecurity: Not on file  Transportation Needs: Not on file  Physical Activity: Not on file  Stress: Not on file  Social Connections: Not on file  Intimate Partner Violence: Not on file    PHYSICAL EXAM  There were no vitals filed for this visit.  There is no height or weight on file to calculate BMI.    12/05/2022    1:50 PM 05/18/2022   11:05 AM  Montreal Cognitive Assessment   Visuospatial/ Executive (0/5) 0 1  Naming (0/3) 2 1  Attention: Read list of digits (0/2) 2 2  Attention: Read list of letters (0/1) 1 1  Attention: Serial 7 subtraction starting at 100 (0/3) 0 1  Language: Repeat phrase (0/2) 2 2  Language : Fluency (0/1) 1 0  Abstraction (0/2) 1 0  Delayed Recall (0/5) 0 0  Orientation (0/6) 2 2  Total 11 10    Generalized: Well  developed, in no acute distress  Neurological examination  Mentation: Alert oriented, most history is provided by his wife, he is cooperative, engaged in visit Cranial nerve II-XII: Pupils were equal round reactive to light. Extraocular movements were full, visual field were full on confrontational test. Facial sensation and strength were normal. Head turning and shoulder shrug  were normal and symmetric. Motor: The motor testing reveals 5 over 5 strength of all 4 extremities. Good symmetric motor tone is noted throughout.  Sensory: Sensory testing is intact to soft touch on all 4 extremities. No evidence of extinction is noted.  Coordination: Cerebellar testing reveals good finger-nose-finger and heel-to-shin bilaterally.  Gait and station: Gait is somewhat wide-based,  gets steadier with distance Reflexes: Deep tendon reflexes are symmetric and normal bilaterally.   DIAGNOSTIC DATA (LABS, IMAGING, TESTING) - I reviewed patient records, labs, notes, testing and imaging myself where available.  No results found for: "WBC", "HGB", "HCT", "MCV", "PLT" No results found for: "NA", "K", "CL", "CO2", "GLUCOSE", "BUN", "CREATININE", "CALCIUM", "PROT", "ALBUMIN", "AST", "ALT", "ALKPHOS", "BILITOT", "GFRNONAA", "GFRAA" No results found for: "CHOL", "HDL", "LDLCALC", "LDLDIRECT", "TRIG", "CHOLHDL" No results found for: "HGBA1C" Lab Results  Component Value Date   VITAMINB12 339 05/18/2021   No results found for: "TSH"  Margie Ege, AGNP-C, DNP 06/26/2023, 11:21 AM [facility] [address], [address] [place], [place] [address] [phone]

## 2023-06-26 ENCOUNTER — Encounter: Payer: Self-pay | Admitting: Neurology

## 2023-06-26 ENCOUNTER — Ambulatory Visit (INDEPENDENT_AMBULATORY_CARE_PROVIDER_SITE_OTHER): Payer: Medicare Other | Admitting: Neurology

## 2023-06-26 VITALS — BP 140/87 | HR 76 | Ht 69.0 in | Wt 208.0 lb

## 2023-06-26 DIAGNOSIS — F0390 Unspecified dementia without behavioral disturbance: Secondary | ICD-10-CM | POA: Insufficient documentation

## 2023-06-26 DIAGNOSIS — R413 Other amnesia: Secondary | ICD-10-CM | POA: Diagnosis not present

## 2023-06-26 DIAGNOSIS — F03B4 Unspecified dementia, moderate, with anxiety: Secondary | ICD-10-CM | POA: Diagnosis not present

## 2023-06-26 MED ORDER — MEMANTINE HCL 10 MG PO TABS: 10.0000 mg | ORAL_TABLET | Freq: Two times a day (BID) | ORAL | 3 refills | Status: DC

## 2023-06-26 NOTE — Patient Instructions (Signed)
Continue Namenda 10 mg twice daily.  Will check screening lab for Alzheimer's disease.  Recommend against driving.  Recommend consistent exercise, brain stimulating activities, healthy eating.

## 2023-07-03 LAB — ATN PROFILE
A -- Beta-amyloid 42/40 Ratio: 0.091 — ABNORMAL LOW (ref >0.102)
Beta-amyloid 40: 149.35 pg/mL
Beta-amyloid 42: 13.66 pg/mL
N -- NfL, Plasma: 4.28 pg/mL (ref 0.00–11.55)
T -- p-tau181: 0.93 pg/mL (ref 0.00–0.97)

## 2023-10-02 IMAGING — CT CT HEAD W/O CM
3 of 4 series · 14 of 47 positions shown, 16 images · non-contrast
Comparison: None Available.

CLINICAL DATA: Head trauma, moderate-severe fall; Neck trauma (Age
>= 65y) fall



[Series 3: head 2.0 h70h · axial · 0.44mm/px · z∈[-176,-50]mm · 8 of 79 slices shown, 10 images]
[im 8/79  brain]
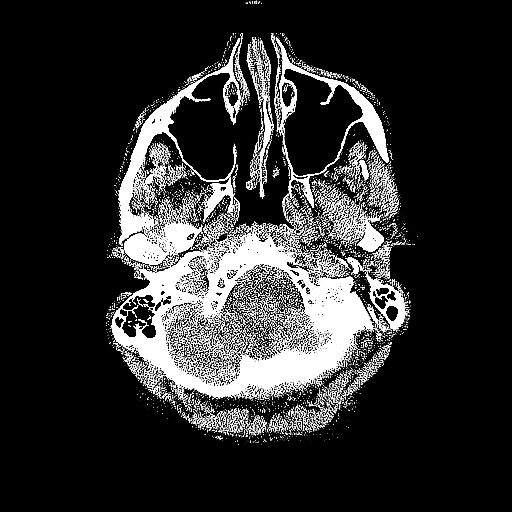
[im 8/79  bone]
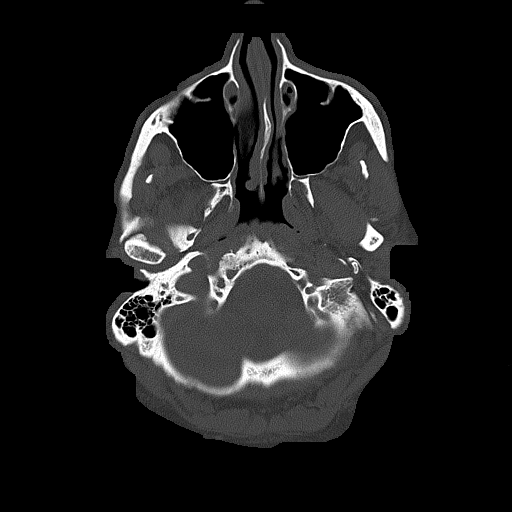
[im 16/79  brain]
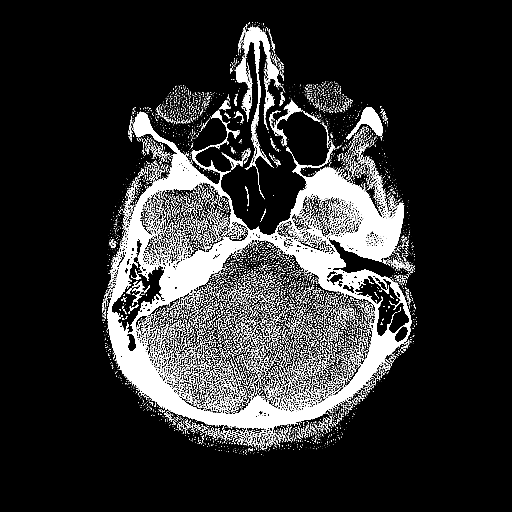
[im 24/79  brain]
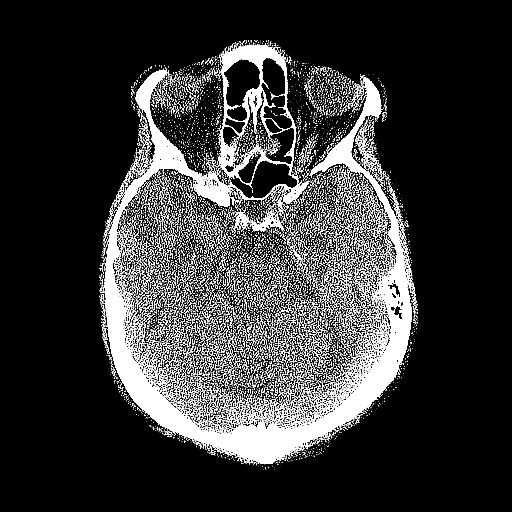
[im 36/79  brain]
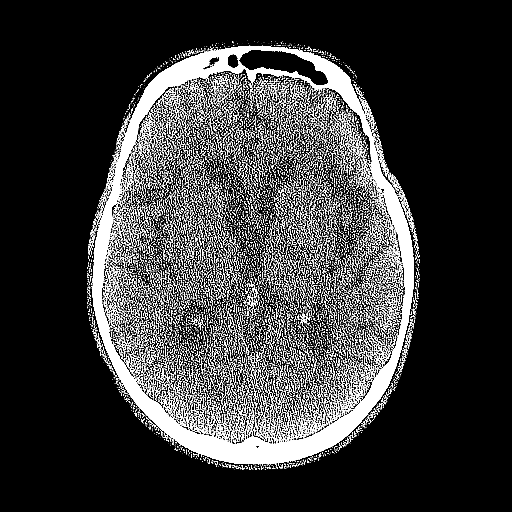
[im 43/79  brain]
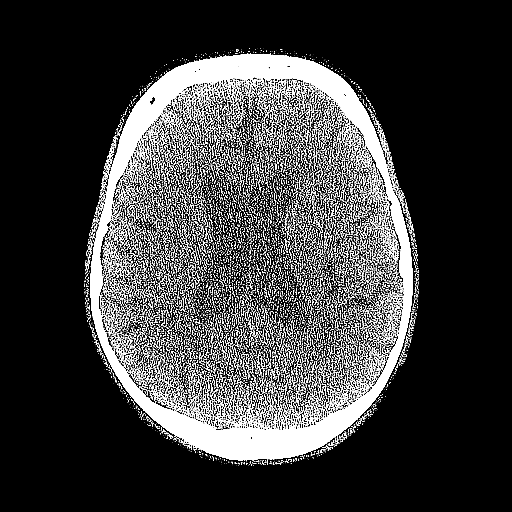
[im 43/79  bone]
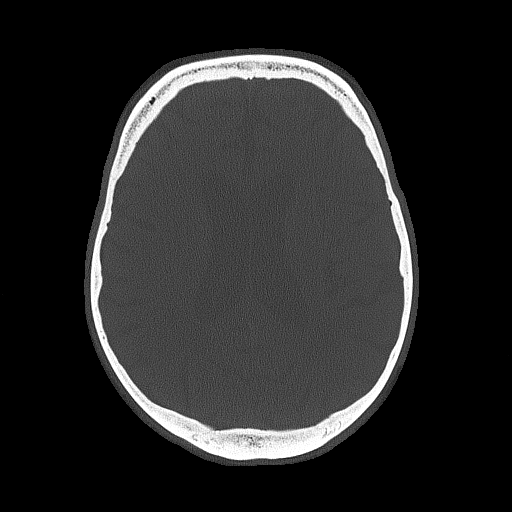
[im 55/79  brain]
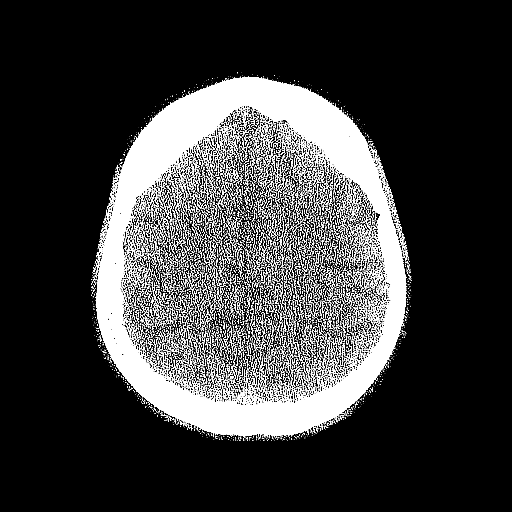
[im 63/79  brain]
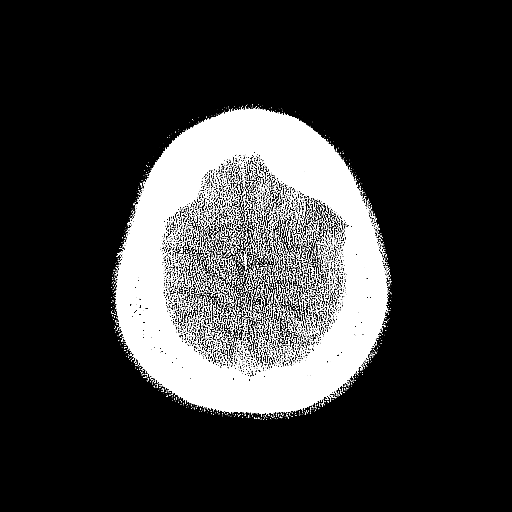
[im 71/79  brain]
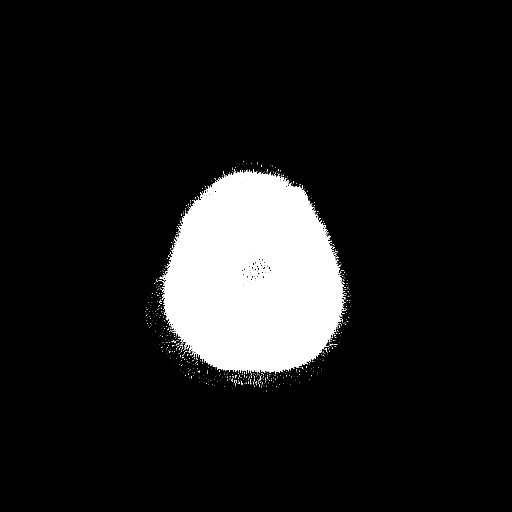

[Series 4: head 3.0 mpr cor · coronal · 0.32mm/px · 3 of 70 slices shown]
[im 24/70  brain]
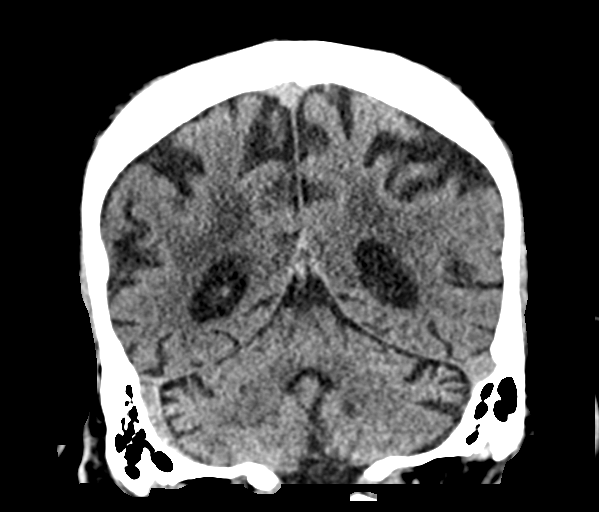
[im 31/70  brain]
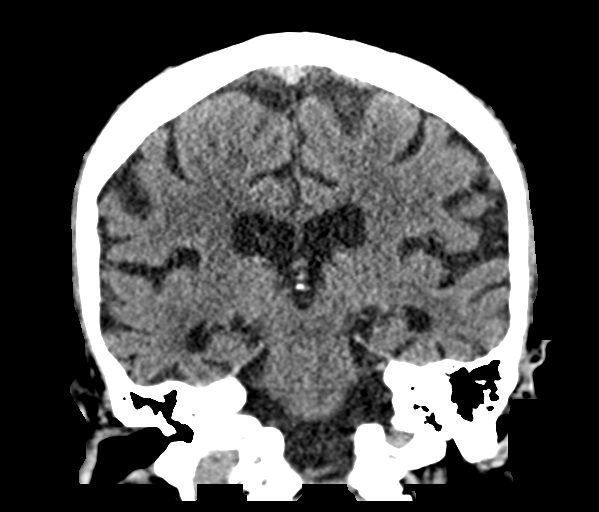
[im 39/70  brain]
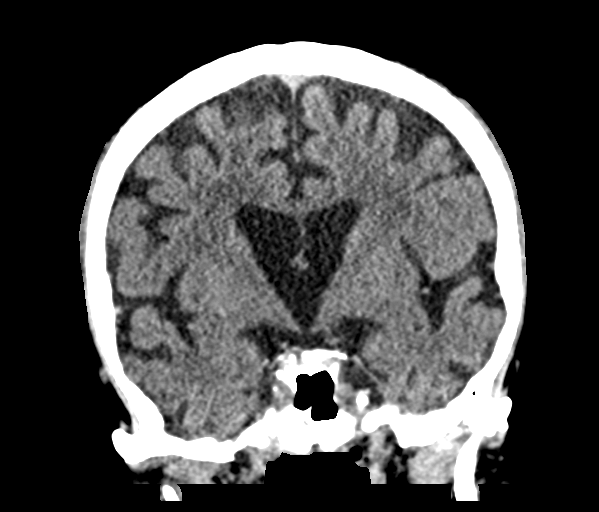

[Series 5: head 3.0 mpr sag · sagittal · 0.31mm/px · 3 of 55 slices shown]
[im 19/55  brain]
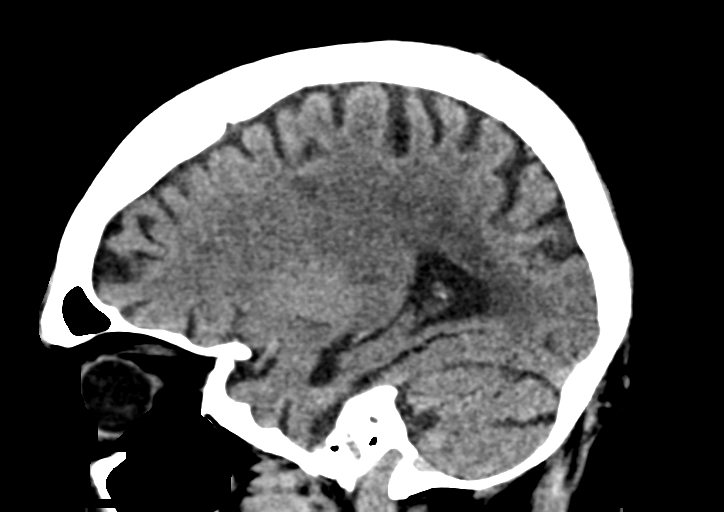
[im 28/55  brain]
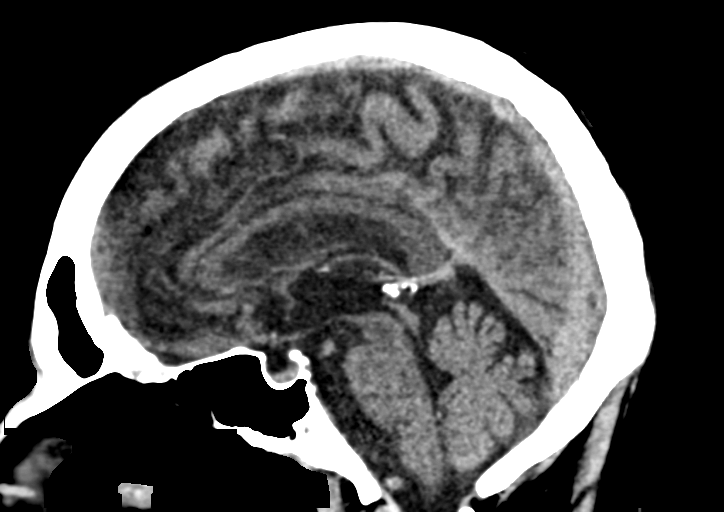
[im 37/55  brain]
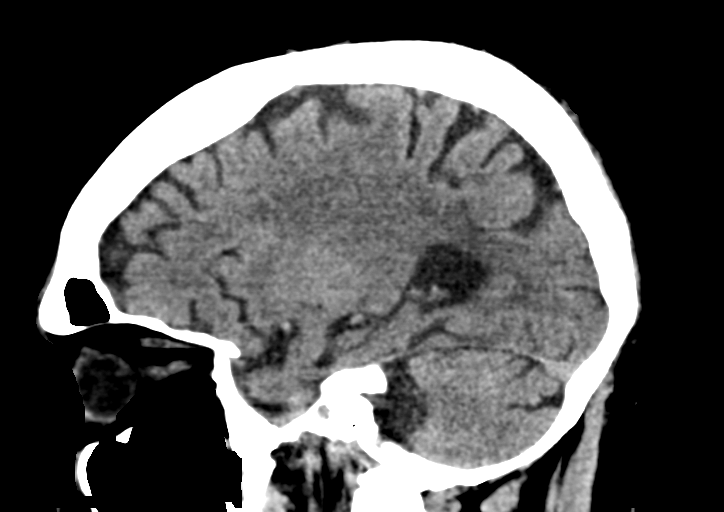

[14 of 47 positions shown; findings below may reference images not displayed]

FINDINGS: CT HEAD FINDINGS

Brain: No evidence of acute infarction, hemorrhage, hydrocephalus,
extra-axial collection or mass lesion/mass effect. Periventricular
white matter hypodensities consistent with sequela of chronic
microvascular ischemic disease.

Vascular: Vascular calcifications

Skull: Normal. Negative for fracture or focal lesion.

Sinuses/Orbits: No acute finding.

Other: None.

CT CERVICAL SPINE FINDINGS

Alignment: Straightening of the cervical lordosis. No
spondylolisthesis.

Skull base and vertebrae: No acute fracture. No primary bone lesion
or focal pathologic process.

Soft tissues and spinal canal: No prevertebral fluid or swelling. No
visible canal hematoma.

Disc levels: Moderate to severe intervertebral disc space height
loss throughout the cervical spine, most pronounced at C5-6 and
C6-7. Multilevel facet arthropathy and uncovertebral hypertrophy
results in moderate osseous neuroforaminal narrowing throughout
predominantly the lower cervical spine, most pronounced at C4-5.
Posterior disc osteophyte complex at C6-7 results in
mild-to-moderate canal narrowing.

Upper chest: Negative.

Other: Vascular calcifications of bilateral carotid arteries.
IMPRESSION: 1.  No acute intracranial abnormality.
2.  No acute fracture or static subluxation of the cervical spine.

## 2024-01-20 NOTE — Progress Notes (Unsigned)
 Patient: Shaun [name] Date of Birth: October 11, 1934  Reason for Visit: Follow up History from: Patient, wife Primary Neurologist: [facility]  ASSESSMENT AND PLAN 89 y.o. year old male   1.  Memory loss, likely dementia -Continue Namenda  10 mg twice a day -Has not been able to tolerate Aricept due to dizziness -Check ATN profile, screen for Alzheimer's -Referral for neuropsychological testing has been placed twice -Recommend against driving -Recommend consistent physical exercise, brain stimulating activities, healthy eating -Discussed monitoring mood in the afternoon, may consider low-dose antidepressant if sundowning worsens -Follow-up in 6 months or sooner if needed  Addendum 07/03/23 SS: ATN profile A+T-N-.  I called his wife.  Results may be consistent presence of Alzheimer's related pathology.  We will continue to monitor and continue with plan as above.  HISTORY OF PRESENT ILLNESS: Today 01/20/24   06/26/23 SS: I increased his Namenda  10 mg BID last visit, no side effects. In afternoons, will say needs to go home when he is already home. Is not driving. Is sedentary, watches TV/sleeps, reads newspaper. Claims he attends work out sessions, wife says no. Sleeps at night, after he falls asleep. Does his own ADLs. Denies any issues at home. Has lost interest in his computer. Doesn't walk daily due to balance issues. Has a trainer twice a week to work on balance. Health has been good.   12/05/22 SS: MOCA 11/30 today. Never heard from neuropsych testing. Memory slightly worse, short term memory is getting worse. He gets lost in driving, is still driving short distances. Wife says he is not driving much anymore. He doesn't drive anywhere alone. Remains on Namenda  5 mg twice daily, his wife medications in the last 6 months. He mostly stays home, watches TV, reads the paper, check his emails. Wife pays the bills. Sleeps good, has a good appetite. He used to read a lot, read 2 novels  weekly, has stopped this.   HISTORY  05/18/22 Dr. Billy Bue Brief HPI: 89 year old male with a history of GERD, HLD who follows in clinic for memory loss.    Interval History: Memory is slightly worse since his last visit. He will forget to turn the stove off when cooking. He will intermittently forget to take medications. He is still driving, did get lost one time 1-2 months ago when he was driving a long distance in an unfamiliar place. Was able to find his way back. Now is limiting to short distances and familiar places.    Tripped and fell in June 2023 and hit his head. Brain MRI 01/28/22 showed generalized atrophy and mild chronic small vessel ischemia. He has been going to PT for vertigo.    He was started on donepezil, but this caused dizziness so it was just switched to Namenda  2 weeks ago. He has been taking 5 mg daily.  REVIEW OF SYSTEMS: Out of a complete 14 system review of symptoms, the patient complains only of the following symptoms, and all other reviewed systems are negative.  See HPI  ALLERGIES: No Known Allergies  HOME MEDICATIONS: Outpatient Medications Prior to Visit  Medication Sig Dispense Refill   atorvastatin (LIPITOR) 10 MG tablet Take by mouth.     cyanocobalamin (VITAMIN B12) 1000 MCG tablet Take 1,000 mcg by mouth daily.     esomeprazole (NEXIUM) 20 MG capsule Take by mouth.     Famotidine (PEPCID PO) Take by mouth.     memantine  (NAMENDA ) 10 MG tablet Take 1 tablet (10 mg total) by mouth 2 (  two) times daily. 180 tablet 3   Multiple Vitamins-Minerals (MULTIVITAMIN WITH MINERALS) tablet Take 1 tablet by mouth daily.     Spironolactone (ALDACTONE PO) Take by mouth as needed. Every other day as needed, wife unsure of dose.     No facility-administered medications prior to visit.    PAST MEDICAL HISTORY: Past Medical History:  Diagnosis Date   Hyperlipidemia    Memory loss     PAST SURGICAL HISTORY: Past Surgical History:  Procedure Laterality Date    APPENDECTOMY     as child   CATARACT EXTRACTION, BILATERAL     HAMMER TOE SURGERY      FAMILY HISTORY: Family History  Problem Relation Age of Onset   Alcoholism Father    Cancer Sister    Stroke Maternal Grandfather    Alcoholism Paternal Grandfather     SOCIAL HISTORY: Social History   Socioeconomic History   Marital status: Married    Spouse name: [name]   Number of children: 3   Years of education: Not on file   Highest education level: Professional school degree (e.g., MD, DDS, DVM, JD)  Occupational History    Comment: retired oral, Associate Professor  Tobacco Use   Smoking status: Former    Current packs/day: 0.50    Average packs/day: 0.5 packs/day for 20.0 years (10.0 ttl pk-yrs)    Types: Cigarettes   Smokeless tobacco: Never  Substance and Sexual Activity   Alcohol use: Yes    Alcohol/week: 7.0 standard drinks of alcohol    Types: 7 Glasses of wine per week   Drug use: Never   Sexual activity: Not Currently  Other Topics Concern   Not on file  Social History Narrative   05/18/22 lives with wife   Left handed    Some caffine    Social Drivers of Health   Financial Resource Strain: Not on file  Food Insecurity: Not on file  Transportation Needs: Not on file  Physical Activity: Not on file  Stress: Not on file  Social Connections: Not on file  Intimate Partner Violence: Not on file    PHYSICAL EXAM  There were no vitals filed for this visit.  There is no height or weight on file to calculate BMI.    12/05/2022    1:50 PM 05/18/2022   11:05 AM  Montreal Cognitive Assessment   Visuospatial/ Executive (0/5) 0 1  Naming (0/3) 2 1  Attention: Read list of digits (0/2) 2 2  Attention: Read list of letters (0/1) 1 1  Attention: Serial 7 subtraction starting at 100 (0/3) 0 1  Language: Repeat phrase (0/2) 2 2  Language : Fluency (0/1) 1 0  Abstraction (0/2) 1 0  Delayed Recall (0/5) 0 0  Orientation (0/6) 2 2  Total 11 10     Generalized: Well developed, in no acute distress  Neurological examination  Mentation: Alert oriented, most history is provided by his wife, he is cooperative, engaged in visit Cranial nerve II-XII: Pupils were equal round reactive to light. Extraocular movements were full, visual field were full on confrontational test. Facial sensation and strength were normal. Head turning and shoulder shrug  were normal and symmetric. Motor: The motor testing reveals 5 over 5 strength of all 4 extremities. Good symmetric motor tone is noted throughout.  Sensory: Sensory testing is intact to soft touch on all 4 extremities. No evidence of extinction is noted.  Coordination: Cerebellar testing reveals good finger-nose-finger and heel-to-shin bilaterally.  Gait and station:  Gait is somewhat wide-based, gets steadier with distance Reflexes: Deep tendon reflexes are symmetric and normal bilaterally.   DIAGNOSTIC DATA (LABS, IMAGING, TESTING) - I reviewed patient records, labs, notes, testing and imaging myself where available.  No results found for: WBC, HGB, HCT, MCV, PLT No results found for: NA, K, CL, CO2, GLUCOSE, BUN, CREATININE, CALCIUM, PROT, ALBUMIN, AST, ALT, ALKPHOS, BILITOT, GFRNONAA, GFRAA No results found for: CHOL, HDL, LDLCALC, LDLDIRECT, TRIG, CHOLHDL No results found for: NWGN5A Lab Results  Component Value Date   VITAMINB12 339 05/18/2021   No results found for: TSH  Jeanmarie Millet, AGNP-C, DNP 01/20/2024, 9:48 PM [facility] [address] [place], [place] [address] [phone]

## 2024-01-21 ENCOUNTER — Encounter: Payer: Self-pay | Admitting: Neurology

## 2024-01-21 ENCOUNTER — Ambulatory Visit (INDEPENDENT_AMBULATORY_CARE_PROVIDER_SITE_OTHER): Admitting: Neurology

## 2024-01-21 VITALS — BP 136/79 | HR 71 | Resp 15 | Ht 70.0 in | Wt 213.6 lb

## 2024-01-21 DIAGNOSIS — F03B4 Unspecified dementia, moderate, with anxiety: Secondary | ICD-10-CM | POA: Diagnosis not present

## 2024-01-21 DIAGNOSIS — R296 Repeated falls: Secondary | ICD-10-CM

## 2024-01-21 MED ORDER — MEMANTINE HCL 10 MG PO TABS: 10.0000 mg | ORAL_TABLET | Freq: Two times a day (BID) | ORAL | 3 refills | Status: AC

## 2024-01-21 NOTE — Patient Instructions (Addendum)
 Continue Namenda . Close supervision of alcohol consumption.  Recommend exercise, healthy eating, brain stimulating activities, management of vascular risk factors.  Close follow-up with primary care.  Recommend against driving.  Follow-up in 1 year or sooner if needed.  Thanks!!

## 2024-01-29 ENCOUNTER — Ambulatory Visit: Payer: Medicare Other | Admitting: Neurology

## 2025-01-20 ENCOUNTER — Ambulatory Visit: Admitting: Neurology
